# Patient Record
Sex: Male | Born: 1990 | Race: White | Hispanic: No | Marital: Single | Smoking: Never smoker
Health system: Southern US, Community
[De-identification: ages and names within clinical notes are randomized; demographics above are authoritative.]

## PROBLEM LIST (undated history)

## (undated) ENCOUNTER — Ambulatory Visit: Admission: EM | Payer: 59

## (undated) DIAGNOSIS — T7840XA Allergy, unspecified, initial encounter: Secondary | ICD-10-CM

## (undated) DIAGNOSIS — D689 Coagulation defect, unspecified: Secondary | ICD-10-CM

## (undated) HISTORY — DX: Allergy, unspecified, initial encounter: T78.40XA

## (undated) HISTORY — DX: Coagulation defect, unspecified: D68.9

---

## 2013-04-24 DIAGNOSIS — I82401 Acute embolism and thrombosis of unspecified deep veins of right lower extremity: Secondary | ICD-10-CM | POA: Insufficient documentation

## 2013-04-24 DIAGNOSIS — I2699 Other pulmonary embolism without acute cor pulmonale: Secondary | ICD-10-CM | POA: Insufficient documentation

## 2013-04-24 DIAGNOSIS — R55 Syncope and collapse: Secondary | ICD-10-CM | POA: Insufficient documentation

## 2013-05-14 DIAGNOSIS — R002 Palpitations: Secondary | ICD-10-CM | POA: Insufficient documentation

## 2020-04-05 ENCOUNTER — Ambulatory Visit: Payer: 59 | Attending: Internal Medicine

## 2020-04-05 DIAGNOSIS — Z23 Encounter for immunization: Secondary | ICD-10-CM

## 2020-04-05 NOTE — Progress Notes (Signed)
   Covid-19 Vaccination Clinic  Name:  Matthew Bass    MRN: [id number] DOB: 1990/03/30  04/05/2020  [name] was observed post Covid-19 immunization for 15 minutes without incident. He was provided with Vaccine Information Sheet and instruction to access the V-Safe system.   [name] was instructed to call 911 with any severe reactions post vaccine: Marland Kitchen Difficulty breathing  . Swelling of face and throat  . A fast heartbeat  . A bad rash all over body  . Dizziness and weakness   Immunizations Administered    Name Date Dose VIS Date Route   PFIZER Comrnaty(Gray TOP) Covid-19 Vaccine 04/05/2020  1:20 PM 0.3 mL 02/19/2020 Intramuscular   Manufacturer: [employer], [employer]   Lot: [id number]   NDC: [phone]

## 2020-04-28 ENCOUNTER — Other Ambulatory Visit: Payer: Self-pay

## 2020-04-28 ENCOUNTER — Ambulatory Visit: Payer: 59 | Admitting: Adult Health

## 2020-05-12 ENCOUNTER — Ambulatory Visit: Payer: 59 | Admitting: Adult Health

## 2020-05-21 ENCOUNTER — Ambulatory Visit
Admission: RE | Admit: 2020-05-21 | Discharge: 2020-05-21 | Disposition: A | Discharge status: Home / Self Care | Payer: 59 | Attending: Adult Health | Admitting: Adult Health

## 2020-05-21 ENCOUNTER — Encounter: Payer: Self-pay | Admitting: Adult Health

## 2020-05-21 ENCOUNTER — Ambulatory Visit
Admission: RE | Admit: 2020-05-21 | Discharge: 2020-05-21 | Disposition: A | Discharge status: Home / Self Care | Payer: 59 | Source: Ambulatory Visit | Attending: Adult Health | Admitting: Adult Health

## 2020-05-21 ENCOUNTER — Ambulatory Visit: Payer: 59 | Admitting: Adult Health

## 2020-05-21 ENCOUNTER — Other Ambulatory Visit: Payer: Self-pay

## 2020-05-21 VITALS — BP 123/57 | HR 63 | Temp 97.7°F | Resp 16 | Ht 70.0 in | Wt 169.2 lb

## 2020-05-21 DIAGNOSIS — Z1389 Encounter for screening for other disorder: Secondary | ICD-10-CM

## 2020-05-21 DIAGNOSIS — M542 Cervicalgia: Secondary | ICD-10-CM | POA: Diagnosis present

## 2020-05-21 DIAGNOSIS — M62838 Other muscle spasm: Secondary | ICD-10-CM | POA: Diagnosis not present

## 2020-05-21 DIAGNOSIS — D689 Coagulation defect, unspecified: Secondary | ICD-10-CM | POA: Diagnosis not present

## 2020-05-21 MED ORDER — CYCLOBENZAPRINE HCL 10 MG PO TABS: 10.0000 mg | ORAL_TABLET | Freq: Every day | ORAL | 0 refills | Status: DC

## 2020-05-21 NOTE — Progress Notes (Signed)
New patient visit   Patient: Matthew Bass   DOB: December 13, 1990   30 y.o. Male  MRN: [id number] Visit Date: 05/21/2020  Today's healthcare provider: Jairo Ben, FNP   Chief Complaint  Patient presents with  . New Patient (Initial Visit)   Subjective    Matthew Bass is a 30 y.o. male who presents today as a new patient to establish care.  HPI  Patient presents in office today to establish care he states that he feels well today but has concerns he would like to address. Patient reports that he follows a well balanced diet, he is actively exercising 5x a week and reports that sleep habits are fairly poor due to chronic neck pain.   Patient would like to discuss today getting a referral for chronic neck pain and migraines associated with neck pain. Patient states that he has seen chriropractor in the past and went through physical therapy, patient reports he normally gets migraines when pain in neck is triggered, he states that if he does not sit up straight his migraines could also be triggered by slouching in chair.   un sequenced disorder Blood clotting disorder, in his family. He had 5 DVT's and 7 Pulmonary embolisms. DR. Marcelino Scot he seen then and now he sees Dr. Reed [facility]ch at [facility]. He is anticoagulated. Xarelto 10mg  po qd.   He has headaches now rarely now but use to be 3 x's a week and mild headaches once a week. He is less on the computer now and this has lessened.   He has had x ray by chiropractor 1 year ago   Patient  denies any fever, body aches,chills, rash, chest pain, shortness of breath, nausea, vomiting, or diarrhea.  Denies dizziness, lightheadedness, pre syncopal or syncopal episodes.    Past Medical History:  Diagnosis Date  . Allergy   . Clotting disorder (HCC)    History reviewed. No pertinent surgical history. Family Status  Relation Name Status  . Mother  (Not Specified)  . Mat Uncle  (Not Specified)  . MGM  (Not Specified)   Family History  Problem  Relation Age of Onset  . Clotting disorder Mother   . Clotting disorder Maternal Uncle   . Clotting disorder Maternal Grandmother    Social History   Socioeconomic History  . Marital status: Single    Spouse name: Not on file  . Number of children: Not on file  . Years of education: Not on file  . Highest education level: Not on file  Occupational History  . Not on file  Tobacco Use  . Smoking status: Never Smoker  . Smokeless tobacco: Never Used  Substance and Sexual Activity  . Alcohol use: Not Currently  . Drug use: Never  . Sexual activity: Yes    Partners: Female  Other Topics Concern  . Not on file  Social History Narrative  . Not on file   Social Determinants of Health   Financial Resource Strain: Not on file  Food Insecurity: Not on file  Transportation Needs: Not on file  Physical Activity: Not on file  Stress: Not on file  Social Connections: Not on file   Outpatient Medications Prior to Visit  Medication Sig  . XARELTO 10 MG TABS tablet SMARTSIG:1 Tablet(s) By Mouth Every Evening  . [DISCONTINUED] pantoprazole (PROTONIX) 40 MG tablet Take 1 tablet by mouth daily.   No facility-administered medications prior to visit.   Allergies  Allergen Reactions  . Other  Pollen    Immunization History  Administered Date(s) Administered  . PFIZER Comirnaty(Gray Top)Covid-19 Tri-Sucrose Vaccine 04/05/2020    Health Maintenance  Topic Date Due  . Hepatitis C Screening  Never done  . HIV Screening  Never done  . TETANUS/TDAP  Never done  . INFLUENZA VACCINE  10/12/2019  . COVID-19 Vaccine (2 - Pfizer 3-dose series) 04/26/2020  . HPV VACCINES  Aged Out    Patient Care Team: Flinchum, Eula FriedMichelle S, FNP as PCP - General (Family Medicine)  Review of Systems  Eyes: Positive for photophobia.  Musculoskeletal: Positive for neck pain and neck stiffness.  Neurological: Positive for headaches.    Last CBC No results found for: WBC, HGB, HCT, MCV, MCH, RDW,  PLT Last metabolic panel No results found for: GLUCOSE, NA, K, CL, CO2, BUN, CREATININE, GFRNONAA, GFRAA, CALCIUM, PHOS, PROT, ALBUMIN, LABGLOB, AGRATIO, BILITOT, ALKPHOS, AST, ALT, ANIONGAP Last lipids No results found for: CHOL, HDL, LDLCALC, LDLDIRECT, TRIG, CHOLHDL Last hemoglobin A1c No results found for: HGBA1C Last thyroid functions No results found for: TSH, T3TOTAL, T4TOTAL, THYROIDAB Last vitamin D No results found for: 25OHVITD2, 25OHVITD3, VD25OH Last vitamin B12 and Folate No results found for: VITAMINB12, FOLATE    Objective    BP (!) 123/57   Pulse 63   Temp 97.7 F (36.5 C) (Oral)   Resp 16   Ht 5\' 10"  (1.778 m)   Wt 169 lb 3.2 oz (76.7 kg)   SpO2 98%   BMI 24.28 kg/m  Physical Exam Vitals and nursing note reviewed.  Constitutional:      General: He is not in acute distress.    Appearance: Normal appearance. He is well-developed. He is not ill-appearing, toxic-appearing or diaphoretic.     Comments: Patient is alert and oriented and responsive to questions Engages in eye contact with provider. Speaks in full sentences without any pauses without any shortness of breath or distress.    HENT:     Head: Normocephalic and atraumatic.     Right Ear: Hearing, tympanic membrane, ear canal and external ear normal.     Left Ear: Hearing, tympanic membrane, ear canal and external ear normal.     Nose: Nose normal.     Mouth/Throat:     Pharynx: Uvula midline. No oropharyngeal exudate.  Eyes:     General: Lids are normal. No scleral icterus.       Right eye: No discharge.        Left eye: No discharge.     Conjunctiva/sclera: Conjunctivae normal.     Pupils: Pupils are equal, round, and reactive to light.  Neck:     Thyroid: No thyromegaly.     Vascular: Normal carotid pulses. No carotid bruit, hepatojugular reflux or JVD.     Trachea: Trachea and phonation normal. No tracheal tenderness or tracheal deviation.     Meningeal: Brudzinski's sign absent.   Cardiovascular:     Rate and Rhythm: Normal rate and regular rhythm.     Pulses: Normal pulses.     Heart sounds: Normal heart sounds, S1 normal and S2 normal. Heart sounds not distant. No murmur heard. No friction rub. No gallop.   Pulmonary:     Effort: Pulmonary effort is normal. No accessory muscle usage or respiratory distress.     Breath sounds: Normal breath sounds. No stridor. No wheezing or rales.  Chest:     Chest wall: No tenderness.  Abdominal:     General: Bowel sounds are normal. There is no  distension.     Palpations: Abdomen is soft. There is no mass.     Tenderness: There is no abdominal tenderness. There is no guarding or rebound.     Hernia: No hernia is present.  Musculoskeletal:        General: No tenderness or deformity. Normal range of motion.     Cervical back: Full passive range of motion without pain, normal range of motion and neck supple. Muscular tenderness present. No spinous process tenderness.     Comments: Patient moves on and off of exam table and in room without difficulty. Gait is normal in hall and in room. Patient is oriented to person place time and situation. Patient answers questions appropriately and engages in conversation.   Lymphadenopathy:     Head:     Right side of head: No submental, submandibular, tonsillar, preauricular, posterior auricular or occipital adenopathy.     Left side of head: No submental, submandibular, tonsillar, preauricular, posterior auricular or occipital adenopathy.     Cervical: No cervical adenopathy.  Skin:    General: Skin is warm and dry.     Capillary Refill: Capillary refill takes less than 2 seconds.     Coloration: Skin is not pale.     Findings: No erythema or rash.     Nails: There is no clubbing.  Neurological:     Mental Status: He is alert and oriented to person, place, and time.     GCS: GCS eye subscore is 4. GCS verbal subscore is 5. GCS motor subscore is 6.     Cranial Nerves: No cranial nerve  deficit.     Sensory: No sensory deficit.     Motor: No abnormal muscle tone.     Coordination: Coordination normal.     Gait: Gait normal.     Deep Tendon Reflexes: Reflexes are normal and symmetric. Reflexes normal.  Psychiatric:        Speech: Speech normal.        Behavior: Behavior normal.        Thought Content: Thought content normal.        Judgment: Judgment normal.      Depression Screen PHQ 2/9 Scores 05/21/2020  PHQ - 2 Score 0  PHQ- 9 Score 0   No results found for any visits on 05/21/20.  Assessment & Plan       Muscle spasm - Plan: DG Cervical Spine Complete  Screening for blood or protein in urine - Plan: POCT urinalysis dipstick  Neck pain - Plan: CBC with Differential/Platelet, Comprehensive metabolic panel, Lipid panel, TSH, DG Cervical Spine Complete  Meds ordered this encounter  Medications  . cyclobenzaprine (FLEXERIL) 10 MG tablet    Sig: Take 1 tablet (10 mg total) by mouth at bedtime. Will make you sleepy.    Dispense:  30 tablet    Refill:  0   Red Flags discussed. The patient was given clear instructions to go to ER or return to medical center if any red flags develop, symptoms do not improve, worsen or new problems develop. They verbalized understanding.   Return in about 1 month (around 06/21/2020), or if symptoms worsen or fail to improve, for at any time for any worsening symptoms, Go to Emergency room/ urgent care if worse.    The entirety of the information documented in the History of Present Illness, Review of Systems and Physical Exam were personally obtained by me. Portions of this information were initially documented by the CMA and  reviewed by me for thoroughness and accuracy.     Jairo Ben, FNP  [facility] [phone] (phone) [phone] (fax)  [facility]

## 2020-05-21 NOTE — Patient Instructions (Addendum)

## 2020-05-21 NOTE — Progress Notes (Signed)
Cervical x ray is within normal limits.

## 2020-05-22 DIAGNOSIS — Z1389 Encounter for screening for other disorder: Secondary | ICD-10-CM | POA: Insufficient documentation

## 2020-05-22 DIAGNOSIS — M62838 Other muscle spasm: Secondary | ICD-10-CM | POA: Insufficient documentation

## 2020-05-22 DIAGNOSIS — M542 Cervicalgia: Secondary | ICD-10-CM | POA: Insufficient documentation

## 2020-05-24 ENCOUNTER — Other Ambulatory Visit: Payer: Self-pay | Admitting: Adult Health

## 2020-05-24 DIAGNOSIS — M542 Cervicalgia: Secondary | ICD-10-CM

## 2020-05-24 NOTE — Progress Notes (Signed)
Orders Placed This Encounter Procedures  Ambulatory referral to Orthopedic Surgery  Provider placed referral as above  he should hear within 2 weeks and if he does not please reach out to [place].

## 2020-05-24 NOTE — Progress Notes (Signed)
Cervical x ray is negative. Try muscle relaxer and stretching if not improving return to the office as discussed at prior office visit;.

## 2020-05-24 NOTE — Progress Notes (Signed)
Orders Placed This Encounter  Procedures  . Ambulatory referral to Orthopedic Surgery  Neck pain - Plan: Ambulatory referral to Orthopedic Surgery

## 2020-05-25 LAB — LIPID PANEL
Chol/HDL Ratio: 4.3 ratio (ref 0.0–5.0)
Cholesterol, Total: 181 mg/dL (ref 100–199)
HDL: 42 mg/dL (ref >39)
LDL Chol Calc (NIH): 99 mg/dL (ref 0–99)
Triglycerides: 233 mg/dL — ABNORMAL HIGH (ref 0–149)
VLDL Cholesterol Cal: 40 mg/dL (ref 5–40)

## 2020-05-25 LAB — COMPREHENSIVE METABOLIC PANEL
ALT: 71 IU/L — ABNORMAL HIGH (ref 0–44)
AST: 65 IU/L — ABNORMAL HIGH (ref 0–40)
Albumin/Globulin Ratio: 1.7 (ref 1.2–2.2)
Albumin: 4.2 g/dL (ref 4.0–5.0)
Alkaline Phosphatase: 100 IU/L (ref 44–121)
BUN/Creatinine Ratio: 16 (ref 9–20)
BUN: 14 mg/dL (ref 6–20)
Bilirubin Total: 0.4 mg/dL (ref 0.0–1.2)
CO2: 25 mmol/L (ref 20–29)
Calcium: 9.5 mg/dL (ref 8.7–10.2)
Chloride: 94 mmol/L — ABNORMAL LOW (ref 96–106)
Creatinine, Ser: 0.87 mg/dL (ref 0.76–1.27)
Globulin, Total: 2.5 g/dL (ref 1.5–4.5)
Glucose: 161 mg/dL — ABNORMAL HIGH (ref 65–99)
Potassium: 4.2 mmol/L (ref 3.5–5.2)
Sodium: 139 mmol/L (ref 134–144)
Total Protein: 6.7 g/dL (ref 6.0–8.5)
eGFR: 118 mL/min/{1.73_m2} (ref >59)

## 2020-05-25 LAB — TSH: TSH: 1.9 u[IU]/mL (ref 0.450–4.500)

## 2020-05-25 LAB — CBC WITH DIFFERENTIAL/PLATELET
Basophils Absolute: 0.1 10*3/uL (ref 0.0–0.2)
Basos: 1 %
EOS (ABSOLUTE): 0.3 10*3/uL (ref 0.0–0.4)
Eos: 3 %
Hematocrit: 48.6 % (ref 37.5–51.0)
Hemoglobin: 15.9 g/dL (ref 13.0–17.7)
Immature Grans (Abs): 0 10*3/uL (ref 0.0–0.1)
Immature Granulocytes: 0 %
Lymphocytes Absolute: 2.8 10*3/uL (ref 0.7–3.1)
Lymphs: 26 %
MCH: 27.5 pg (ref 26.6–33.0)
MCHC: 32.7 g/dL (ref 31.5–35.7)
MCV: 84 fL (ref 79–97)
Monocytes Absolute: 1 10*3/uL — ABNORMAL HIGH (ref 0.1–0.9)
Monocytes: 10 %
Neutrophils Absolute: 6.3 10*3/uL (ref 1.4–7.0)
Neutrophils: 60 %
Platelets: 260 10*3/uL (ref 150–450)
RBC: 5.78 x10E6/uL (ref 4.14–5.80)
RDW: 13.7 % (ref 11.6–15.4)
WBC: 10.5 10*3/uL (ref 3.4–10.8)

## 2020-05-27 NOTE — Progress Notes (Signed)
CBC ok.  CMP- glucose is elevated - add on hemoglobin A 1 C. AST/ ALT liver enzymes elevated, add on lab acute hepatitis panel and GGT.   Triglycerides elevated, likely from elevated glucose. Discuss lifestyle modification with patient e.g. increase exercise, fiber, fruits, vegetables, lean meat, and omega 3/fish intake and decrease saturated fat.  If patient following strict diet and exercise program already please schedule follow up appointment with primary care physician

## 2020-05-29 LAB — SPECIMEN STATUS REPORT

## 2020-05-29 LAB — HEMOGLOBIN A1C
Est. average glucose Bld gHb Est-mCnc: 148 mg/dL
Hgb A1c MFr Bld: 6.8 % — ABNORMAL HIGH (ref 4.8–5.6)

## 2020-05-31 ENCOUNTER — Telehealth: Payer: Self-pay

## 2020-05-31 MED ORDER — XARELTO 10 MG PO TABS: ORAL_TABLET | ORAL | 3 refills | Status: DC

## 2020-05-31 NOTE — Telephone Encounter (Signed)
Ok to fill, he has known clotting disorder.

## 2020-05-31 NOTE — Progress Notes (Signed)
Hemoglobin A1C returned Diabetic range. Need to increase exercise and monitor diet can discuss treatment at visit already scheduled for follow up.

## 2020-05-31 NOTE — Telephone Encounter (Signed)
[facility] faxed refill request for the following medications:  XARELTO 10 MG TABS tablet   Please advise.

## 2020-05-31 NOTE — Telephone Encounter (Signed)
Please review. KW 

## 2020-06-07 ENCOUNTER — Ambulatory Visit: Payer: Self-pay | Admitting: Adult Health

## 2020-06-07 DIAGNOSIS — D509 Iron deficiency anemia, unspecified: Secondary | ICD-10-CM | POA: Insufficient documentation

## 2020-06-07 NOTE — Progress Notes (Deleted)
Acute Office Visit  Subjective:    Patient ID: Matthew Bass, male    DOB: [birth year], 30 y.o.   MRN: [id number]  No chief complaint on file.   HPI Patient is in today to discuss results of recent labs.  He has concerns about his Hgb A1C being 6.8 on 05/24/20.  He reports he follows a healthy diet and is actively exercising.  Past Medical History:  Diagnosis Date  . Allergy   . Clotting disorder (HCC)     No past surgical history on file.  Family History  Problem Relation Age of Onset  . Clotting disorder Mother   . Clotting disorder Maternal Uncle   . Clotting disorder Maternal Grandmother     Social History   Socioeconomic History  . Marital status: Single    Spouse name: Not on file  . Number of children: Not on file  . Years of education: Not on file  . Highest education level: Not on file  Occupational History  . Not on file  Tobacco Use  . Smoking status: Never Smoker  . Smokeless tobacco: Never Used  Substance and Sexual Activity  . Alcohol use: Not Currently  . Drug use: Never  . Sexual activity: Yes    Partners: Female  Other Topics Concern  . Not on file  Social History Narrative  . Not on file   Social Determinants of Health   Financial Resource Strain: Not on file  Food Insecurity: Not on file  Transportation Needs: Not on file  Physical Activity: Not on file  Stress: Not on file  Social Connections: Not on file  Intimate Partner Violence: Not on file    Outpatient Medications Prior to Visit  Medication Sig Dispense Refill  . cyclobenzaprine (FLEXERIL) 10 MG tablet Take 1 tablet (10 mg total) by mouth at bedtime. Will make you sleepy. 30 tablet 0  . XARELTO 10 MG TABS tablet SMARTSIG:1 Tablet(s) By Mouth Every Evening 30 tablet 3   No facility-administered medications prior to visit.    Allergies  Allergen Reactions  . Other     Pollen    Review of Systems     Objective:    Physical Exam  There were no vitals taken for this  visit. Wt Readings from Last 3 Encounters:  05/21/20 169 lb 3.2 oz (76.7 kg)    Health Maintenance Due  Topic Date Due  . Hepatitis C Screening  Never done  . HIV Screening  Never done  . TETANUS/TDAP  Never done  . INFLUENZA VACCINE  10/12/2019  . COVID-19 Vaccine (2 - Pfizer 3-dose series) 04/26/2020    There are no preventive care reminders to display for this patient.   Lab Results  Component Value Date   TSH 1.900 05/24/2020   Lab Results  Component Value Date   WBC 10.5 05/24/2020   HGB 15.9 05/24/2020   HCT 48.6 05/24/2020   MCV 84 05/24/2020   PLT 260 05/24/2020   Lab Results  Component Value Date   NA 139 05/24/2020   K 4.2 05/24/2020   CO2 25 05/24/2020   GLUCOSE 161 (H) 05/24/2020   BUN 14 05/24/2020   CREATININE 0.87 05/24/2020   BILITOT 0.4 05/24/2020   ALKPHOS 100 05/24/2020   AST 65 (H) 05/24/2020   ALT 71 (H) 05/24/2020   PROT 6.7 05/24/2020   ALBUMIN 4.2 05/24/2020   CALCIUM 9.5 05/24/2020   Lab Results  Component Value Date   CHOL 181 05/24/2020  Lab Results  Component Value Date   HDL 42 05/24/2020   Lab Results  Component Value Date   LDLCALC 99 05/24/2020   Lab Results  Component Value Date   TRIG 233 (H) 05/24/2020   Lab Results  Component Value Date   CHOLHDL 4.3 05/24/2020   Lab Results  Component Value Date   HGBA1C 6.8 (H) 05/24/2020       Assessment & Plan:   Problem List Items Addressed This Visit   None      No orders of the defined types were placed in this encounter.    Adline Peals, CMA

## 2020-06-10 ENCOUNTER — Telehealth (INDEPENDENT_AMBULATORY_CARE_PROVIDER_SITE_OTHER): Payer: 59 | Admitting: Adult Health

## 2020-06-10 ENCOUNTER — Other Ambulatory Visit: Payer: Self-pay | Admitting: Adult Health

## 2020-06-10 ENCOUNTER — Encounter: Payer: Self-pay | Admitting: Adult Health

## 2020-06-10 DIAGNOSIS — E119 Type 2 diabetes mellitus without complications: Secondary | ICD-10-CM

## 2020-06-10 DIAGNOSIS — R748 Abnormal levels of other serum enzymes: Secondary | ICD-10-CM | POA: Diagnosis not present

## 2020-06-10 MED ORDER — BLOOD GLUCOSE METER KIT: PACK | 0 refills | Status: DC

## 2020-06-10 NOTE — Patient Instructions (Signed)
Blood Glucose Monitoring, Adult Monitoring your blood sugar (glucose) is an important part of managing your diabetes. Blood glucose monitoring involves checking your blood glucose as often as directed and keeping a log or record of your results over time. Checking your blood glucose regularly and keeping a blood glucose log can:  Help you and your health care provider adjust your diabetes management plan as needed, including your medicines or insulin.  Help you understand how food, exercise, illnesses, and medicines affect your blood glucose.  Let you know what your blood glucose is at any time. You can quickly find out if you have low blood glucose (hypoglycemia) or high blood glucose (hyperglycemia). Your health care provider will set individualized treatment goals for you. Your goals will be based on your age, other medical conditions you have, and how you respond to diabetes treatment. Generally, the goal of treatment is to maintain the following blood glucose levels:  Before meals (preprandial): 80-130 mg/dL (4.4-7.2 mmol/L).  After meals (postprandial): below 180 mg/dL (10 mmol/L).  A1C level: less than 7%. Supplies needed:  Blood glucose meter.  Test strips for your meter. Each meter has its own strips. You must use the strips that came with your meter.  A needle to prick your finger (lancet). Do not use a lancet more than one time.  A device that holds the lancet (lancing device).  A journal or log book to write down your results. How to check your blood glucose Checking your blood glucose 1. Wash your hands for at least 20 seconds with soap and water. 2. Prick the side of your finger (not the tip) with the lancet. Do not use the same finger consecutively. 3. Gently rub the finger until a small drop of blood appears. 4. Follow instructions that come with your meter for inserting the test strip, applying blood to the strip, and using your blood glucose meter. 5. Write down  your result and any notes in your log.   Using alternative sites Some meters allow you to use areas of your body other than your finger (alternative sites) to test your blood. The most common alternative sites are the forearm, the thigh, and the palm of your hand. Alternative sites may not be as accurate as the fingers because blood flow is slower in those areas. This means that the result you get may be delayed, and it may be different from the result that you would get from your finger. Use the finger only, and do not use alternative sites, if:  You think you have hypoglycemia.  You sometimes do not know that your blood glucose is getting low (hypoglycemia unawareness). General tips and recommendations Blood glucose log  Every time you check your blood glucose, write down your result. Also write down any notes about things that may be affecting your blood glucose, such as your diet and exercise for the day. This information can help you and your health care provider: ? Look for patterns in your blood glucose over time. ? Adjust your diabetes management plan as needed.  Check if your meter allows you to download your records to a computer or if there is an app for the meter. Most glucose meters store a record of glucose readings in the meter.   If you have type 1 diabetes:  Check your blood glucose 4 or more times a day if you are on intensive insulin therapy with multiple daily injections (MDI) or if you are using an insulin pump. Check your  blood glucose: ? Before every meal and snack. ? Before bedtime.  Also check your blood glucose: ? If you have symptoms of hypoglycemia. ? After treating low blood glucose. ? Before doing activities that create a risk for injury, like driving or using machinery. ? Before and after exercise. ? Two hours after a meal. ? Occasionally between 2:00 a.m. and 3:00 a.m., as directed.  You may need to check your blood glucose more often, 6-10 times per  day, if: ? You have diabetes that is not well controlled. ? You are ill. ? You have a history of severe hypoglycemia. ? You have hypoglycemia unawareness. If you have type 2 diabetes:  Check your blood glucose 2 or more times a day if you take insulin or other diabetes medicines.  Check your blood glucose 4 or more times a day if you are on intensive insulin therapy. Occasionally, you may also need to check your glucose between 2:00 a.m. and 3:00 a.m., as directed.  Also check your blood glucose: ? Before and after exercise. ? Before doing activities that create a risk for injury, like driving or using machinery.  You may need to check your blood glucose more often if: ? Your medicine is being adjusted. ? Your diabetes is not well controlled. ? You are ill. General tips  Make sure you always have your supplies with you.  After you use a few boxes of test strips, adjust (calibrate) your blood glucose meter by following instructions that came with your meter.  If you have questions or need help, all blood glucose meters have a 24-hour hotline phone number available that you can call. Also contact your health care provider with questions or concerns you may have. Where to find more information  The American Diabetes Association: www.diabetes.org  The Association of Diabetes Care & Education Specialists: www.diabeteseducator.org Contact a health care provider if:  Your blood glucose is at or above 240 mg/dL (13.3 mmol/L) for 2 days in a row.  You have been sick or have had a fever for 2 days or longer, and you are not getting better.  You have any of the following problems for more than 6 hours: ? You cannot eat or drink. ? You have nausea or vomiting. ? You have diarrhea. Get help right away if:  Your blood glucose is lower than 54 mg/dL (3 mmol/L).  You become confused, or you have trouble thinking clearly.  You have difficulty breathing.  You have moderate or large  ketone levels in your urine. These symptoms may represent a serious problem that is an emergency. Do not wait to see if the symptoms will go away. Get medical help right away. Call your local emergency services (911 in the [place] Do not drive yourself to the hospital. Summary  Monitoring your blood glucose is an important part of managing your diabetes.  Blood glucose monitoring involves checking your blood glucose as often as directed and keeping a log or record of your results over time.  Your health care provider will set individualized treatment goals for you. Your goals will be based on your age, other medical conditions you have, and how you respond to diabetes treatment.  Every time you check your blood glucose, write down your result. Also, write down any notes about things that may be affecting your blood glucose, such as your diet and exercise for the day. This information is not intended to replace advice given to you by your health care provider. Make sure  you discuss any questions you have with your health care provider. Document Revised: 11/26/2019 Document Reviewed: 11/26/2019 Elsevier Patient Education  2021 [employer]. Diabetes Mellitus Basics  Diabetes mellitus, or diabetes, is a long-term (chronic) disease. It occurs when the body does not properly use sugar (glucose) that is released from food after you eat. Diabetes mellitus may be caused by one or both of these problems:  Your pancreas does not make enough of a hormone called insulin.  Your body does not react in a normal way to the insulin that it makes. Insulin lets glucose enter cells in your body. This gives you energy. If you have diabetes, glucose cannot get into cells. This causes high blood glucose (hyperglycemia). How to treat and manage diabetes You may need to take insulin or other diabetes medicines daily to keep your glucose in balance. If you are prescribed insulin, you will learn how to give yourself  insulin by injection. You may need to adjust the amount of insulin you take based on the foods that you eat. You will need to check your blood glucose levels using a glucose monitor as told by your health care provider. The readings can help determine if you have low or high blood glucose. Generally, you should have these blood glucose levels:  Before meals (preprandial): 80-130 mg/dL (1.6-1.0 mmol/L).  After meals (postprandial): below 180 mg/dL (10 mmol/L).  Hemoglobin A1c (HbA1c) level: less than 7%. Your health care provider will set treatment goals for you. Keep all follow-up visits. This is important. Follow these instructions at home: Diabetes medicines Take your diabetes medicines every day as told by your health care provider. List your diabetes medicines here:  Name of medicine: ______________________________ ? Amount (dose): _______________ Time (a.m./p.m.): _______________ Notes: ___________________________________  Name of medicine: ______________________________ ? Amount (dose): _______________ Time (a.m./p.m.): _______________ Notes: ___________________________________  Name of medicine: ______________________________ ? Amount (dose): _______________ Time (a.m./p.m.): _______________ Notes: ___________________________________ Insulin If you use insulin, list the types of insulin you use here:  Insulin type: ______________________________ ? Amount (dose): _______________ Time (a.m./p.m.): _______________Notes: ___________________________________  Insulin type: ______________________________ ? Amount (dose): _______________ Time (a.m./p.m.): _______________ Notes: ___________________________________  Insulin type: ______________________________ ? Amount (dose): _______________ Time (a.m./p.m.): _______________ Notes: ___________________________________  Insulin type: ______________________________ ? Amount (dose): _______________ Time (a.m./p.m.): _______________  Notes: ___________________________________  Insulin type: ______________________________ ? Amount (dose): _______________ Time (a.m./p.m.): _______________ Notes: ___________________________________ Managing blood glucose Check your blood glucose levels using a glucose monitor as told by your health care provider. Write down the times that you check your glucose levels here:  Time: _______________ Notes: ___________________________________  Time: _______________ Notes: ___________________________________  Time: _______________ Notes: ___________________________________  Time: _______________ Notes: ___________________________________  Time: _______________ Notes: ___________________________________  Time: _______________ Notes: ___________________________________   Low blood glucose Low blood glucose (hypoglycemia) is when glucose is at or below 70 mg/dL (3.9 mmol/L). Symptoms may include:  Feeling: ? Hungry. ? Sweaty and clammy. ? Irritable or easily upset. ? Dizzy. ? Sleepy.  Having: ? A fast heartbeat. ? A headache. ? A change in your vision. ? Numbness around the mouth, lips, or tongue.  Having trouble with: ? Moving (coordination). ? Sleeping. Treating low blood glucose To treat low blood glucose, eat or drink something containing sugar right away. If you can think clearly and swallow safely, follow the 15:15 rule:  Take 15 grams of a fast-acting carb (carbohydrate), as told by your health care provider.  Some fast-acting carbs are: ? Glucose tablets: take 3-4 tablets. ? Hard candy: eat  3-5 pieces. ? Fruit juice: drink 4 oz (120 mL). ? Regular (not diet) soda: drink 4-6 oz (120-180 mL). ? Honey or sugar: eat 1 Tbsp (15 mL).  Check your blood glucose levels 15 minutes after you take the carb.  If your glucose is still at or below 70 mg/dL (3.9 mmol/L), take 15 grams of a carb again.  If your glucose does not go above 70 mg/dL (3.9 mmol/L) after 3 tries, get  help right away.  After your glucose goes back to normal, eat a meal or a snack within 1 hour. Treating very low blood glucose If your glucose is at or below 54 mg/dL (3 mmol/L), you have very low blood glucose (severe hypoglycemia). This is an emergency. Do not wait to see if the symptoms will go away. Get medical help right away. Call your local emergency services (911 in the [place] Do not drive yourself to the hospital. Questions to ask your health care provider  Should I talk with a diabetes educator?  What equipment will I need to care for myself at home?  What diabetes medicines do I need? When should I take them?  How often do I need to check my blood glucose levels?  What number can I call if I have questions?  When is my follow-up visit?  Where can I find a support group for people with diabetes? Where to find more information  American Diabetes Association: www.diabetes.org  Association of Diabetes Care and Education Specialists: www.diabeteseducator.org Contact a health care provider if:  Your blood glucose is at or above 240 mg/dL (95.6 mmol/L) for 2 days in a row.  You have been sick or have had a fever for 2 days or more, and you are not getting better.  You have any of these problems for more than 6 hours: ? You cannot eat or drink. ? You feel nauseous. ? You vomit. ? You have diarrhea. Get help right away if:  Your blood glucose is lower than 54 mg/dL (3 mmol/L).  You get confused.  You have trouble thinking clearly.  You have trouble breathing. These symptoms may represent a serious problem that is an emergency. Do not wait to see if the symptoms will go away. Get medical help right away. Call your local emergency services (911 in the [place] Do not drive yourself to the hospital. Summary  Diabetes mellitus is a chronic disease that occurs when the body does not properly use sugar (glucose) that is released from food after you eat.  Take insulin and  diabetes medicines as told.  Check your blood glucose every day, as often as told.  Keep all follow-up visits. This is important. This information is not intended to replace advice given to you by your health care provider. Make sure you discuss any questions you have with your health care provider. Document Revised: 07/01/2019 Document Reviewed: 07/01/2019 Elsevier Patient Education  2021 Elsevier Inc. Diabetes Mellitus and Nutrition, Adult When you have diabetes, or diabetes mellitus, it is very important to have healthy eating habits because your blood sugar (glucose) levels are greatly affected by what you eat and drink. Eating healthy foods in the right amounts, at about the same times every day, can help you:  Control your blood glucose.  Lower your risk of heart disease.  Improve your blood pressure.  Reach or maintain a healthy weight. What can affect my meal plan? Every person with diabetes is different, and each person has different needs for a meal  plan. Your health care provider may recommend that you work with a dietitian to make a meal plan that is best for you. Your meal plan may vary depending on factors such as:  The calories you need.  The medicines you take.  Your weight.  Your blood glucose, blood pressure, and cholesterol levels.  Your activity level.  Other health conditions you have, such as heart or kidney disease. How do carbohydrates affect me? Carbohydrates, also called carbs, affect your blood glucose level more than any other type of food. Eating carbs naturally raises the amount of glucose in your blood. Carb counting is a method for keeping track of how many carbs you eat. Counting carbs is important to keep your blood glucose at a healthy level, especially if you use insulin or take certain oral diabetes medicines. It is important to know how many carbs you can safely have in each meal. This is different for every person. Your dietitian can help you  calculate how many carbs you should have at each meal and for each snack. How does alcohol affect me? Alcohol can cause a sudden decrease in blood glucose (hypoglycemia), especially if you use insulin or take certain oral diabetes medicines. Hypoglycemia can be a life-threatening condition. Symptoms of hypoglycemia, such as sleepiness, dizziness, and confusion, are similar to symptoms of having too much alcohol.  Do not drink alcohol if: ? Your health care provider tells you not to drink. ? You are pregnant, may be pregnant, or are planning to become pregnant.  If you drink alcohol: ? Do not drink on an empty stomach. ? Limit how much you use to:  0-1 drink a day for women.  0-2 drinks a day for men. ? Be aware of how much alcohol is in your drink. In the [place] one drink equals one 12 oz bottle of beer (355 mL), one 5 oz glass of wine (148 mL), or one 1 oz glass of hard liquor (44 mL). ? Keep yourself hydrated with water, diet soda, or unsweetened iced tea.  Keep in mind that regular soda, juice, and other mixers may contain a lot of sugar and must be counted as carbs. What are tips for following this plan? Reading food labels  Start by checking the serving size on the "Nutrition Facts" label of packaged foods and drinks. The amount of calories, carbs, fats, and other nutrients listed on the label is based on one serving of the item. Many items contain more than one serving per package.  Check the total grams (g) of carbs in one serving. You can calculate the number of servings of carbs in one serving by dividing the total carbs by 15. For example, if a food has 30 g of total carbs per serving, it would be equal to 2 servings of carbs.  Check the number of grams (g) of saturated fats and trans fats in one serving. Choose foods that have a low amount or none of these fats.  Check the number of milligrams (mg) of salt (sodium) in one serving. Most people should limit total sodium intake to  less than 2,300 mg per day.  Always check the nutrition information of foods labeled as "low-fat" or "nonfat." These foods may be higher in added sugar or refined carbs and should be avoided.  Talk to your dietitian to identify your daily goals for nutrients listed on the label. Shopping  Avoid buying canned, pre-made, or processed foods. These foods tend to be high in fat, sodium,  and added sugar.  Shop around the outside edge of the grocery store. This is where you will most often find fresh fruits and vegetables, bulk grains, fresh meats, and fresh dairy. Cooking  Use low-heat cooking methods, such as baking, instead of high-heat cooking methods like deep frying.  Cook using healthy oils, such as olive, canola, or sunflower oil.  Avoid cooking with butter, cream, or high-fat meats. Meal planning  Eat meals and snacks regularly, preferably at the same times every day. Avoid going long periods of time without eating.  Eat foods that are high in fiber, such as fresh fruits, vegetables, beans, and whole grains. Talk with your dietitian about how many servings of carbs you can eat at each meal.  Eat 4-6 oz (112-168 g) of lean protein each day, such as lean meat, chicken, fish, eggs, or tofu. One ounce (oz) of lean protein is equal to: ? 1 oz (28 g) of meat, chicken, or fish. ? 1 egg. ?  cup (62 g) of tofu.  Eat some foods each day that contain healthy fats, such as avocado, nuts, seeds, and fish.   What foods should I eat? Fruits Berries. Apples. Oranges. Peaches. Apricots. Plums. Grapes. Mango. Papaya. Pomegranate. Kiwi. Cherries. Vegetables Lettuce. Spinach. Leafy greens, including kale, chard, collard greens, and mustard greens. Beets. Cauliflower. Cabbage. Broccoli. Carrots. Green beans. Tomatoes. Peppers. Onions. Cucumbers. Brussels sprouts. Grains Whole grains, such as whole-wheat or whole-grain bread, crackers, tortillas, cereal, and pasta. Unsweetened oatmeal. Quinoa. Brown  or wild rice. Meats and other proteins Seafood. Poultry without skin. Lean cuts of poultry and beef. Tofu. Nuts. Seeds. Dairy Low-fat or fat-free dairy products such as milk, yogurt, and cheese. The items listed above may not be a complete list of foods and beverages you can eat. Contact a dietitian for more information. What foods should I avoid? Fruits Fruits canned with syrup. Vegetables Canned vegetables. Frozen vegetables with butter or cream sauce. Grains Refined white flour and flour products such as bread, pasta, snack foods, and cereals. Avoid all processed foods. Meats and other proteins Fatty cuts of meat. Poultry with skin. Breaded or fried meats. Processed meat. Avoid saturated fats. Dairy Full-fat yogurt, cheese, or milk. Beverages Sweetened drinks, such as soda or iced tea. The items listed above may not be a complete list of foods and beverages you should avoid. Contact a dietitian for more information. Questions to ask a health care provider  Do I need to meet with a diabetes educator?  Do I need to meet with a dietitian?  What number can I call if I have questions?  When are the best times to check my blood glucose? Where to find more information:  American Diabetes Association: diabetes.org  Academy of Nutrition and Dietetics: www.eatright.[employer]  National Institute of Diabetes and Digestive and Kidney Diseases: [employer].tnwww.niddk.nih.gov  Association of Diabetes Care and Education Specialists: www.diabeteseducator.org Summary  It is important to have healthy eating habits because your blood sugar (glucose) levels are greatly affected by what you eat and drink.  A healthy meal plan will help you control your blood glucose and maintain a healthy lifestyle.  Your health care provider may recommend that you work with a dietitian to make a meal plan that is best for you.  Keep in mind that carbohydrates (carbs) and alcohol have immediate effects on your blood glucose  levels. It is important to count carbs and to use alcohol carefully. This information is not intended to replace advice given to you by your  health care provider. Make sure you discuss any questions you have with your health care provider. Document Revised: 02/04/2019 Document Reviewed: 02/04/2019 Elsevier Patient Education  2021 [employer].

## 2020-06-10 NOTE — Progress Notes (Signed)
  MyChart Video Visit    Virtual Visit via Video Note   This visit type was conducted due to national recommendations for restrictions regarding the COVID-19 Pandemic (e.g. social distancing) in an effort to limit this patient's exposure and mitigate transmission in our community. This patient is at least at moderate risk for complications without adequate follow up. This format is felt to be most appropriate for this patient at this time. Physical exam was limited by quality of the video and audio technology used for the visit.  Parties involved in visit as below:   Patient location: at home Provider location: Provider: Provider's office at  [facility], [place].  I discussed the limitations of evaluation and management by telemedicine and the availability of in person appointments. The patient expressed understanding and agreed to proceed.  Patient: Matthew Bass   DOB: 12/18/1990   30 y.o. Male  MRN: [id number] Visit Date: 06/10/2020  Today's healthcare provider: Michelle Smith Flinchum, FNP   Chief Complaint  Patient presents with  . Abnormal Lab    Patient presents today to address concern of recent lab results and diabetes diagnosis.    Subjective    HPI HPI    Abnormal Lab     Additional comments: Patient presents today to address concern of recent lab results and diabetes diagnosis.        Last edited by Wolford, Kathleen J, CMA on 06/10/2020  9:37 AM. (History)      Patient  denies any fever, body aches,chills, rash, chest pain, shortness of breath, nausea, vomiting, or diarrhea.  Denies dizziness, lightheadedness, pre syncopal or syncopal episodes.     Patient Active Problem List   Diagnosis Date Noted  . Iron deficiency anemia, unspecified 06/07/2020  . Muscle spasm 05/22/2020  . Screening for blood or protein in urine 05/22/2020  . Neck pain 05/22/2020  . Heart palpitations 05/14/2013  . Bilateral pulmonary embolism (HCC) 04/24/2013  .  Right leg DVT (HCC) 04/24/2013  . Syncope 04/24/2013   Past Medical History:  Diagnosis Date  . Allergy   . Clotting disorder (HCC)    Family History  Problem Relation Age of Onset  . Clotting disorder Mother   . Clotting disorder Maternal Uncle   . Clotting disorder Maternal Grandmother    Allergies  Allergen Reactions  . Other     Pollen      Medications: Outpatient Medications Prior to Visit  Medication Sig  . cyclobenzaprine (FLEXERIL) 10 MG tablet Take 1 tablet (10 mg total) by mouth at bedtime. Will make you sleepy.  . meloxicam (MOBIC) 15 MG tablet Take 15 mg by mouth daily.  . XARELTO 10 MG TABS tablet SMARTSIG:1 Tablet(s) By Mouth Every Evening   No facility-administered medications prior to visit.    Review of Systems  All other systems reviewed and are negative.   Last metabolic panel Lab Results  Component Value Date   GLUCOSE 161 (H) 05/24/2020   NA 139 05/24/2020   K 4.2 05/24/2020   CL 94 (L) 05/24/2020   CO2 25 05/24/2020   BUN 14 05/24/2020   CREATININE 0.87 05/24/2020   CALCIUM 9.5 05/24/2020   PROT 6.7 05/24/2020   ALBUMIN 4.2 05/24/2020   LABGLOB 2.5 05/24/2020   AGRATIO 1.7 05/24/2020   BILITOT 0.4 05/24/2020   ALKPHOS 100 05/24/2020   AST 65 (H) 05/24/2020   ALT 71 (H) 05/24/2020   Last lipids Lab Results  Component Value Date   CHOL 181 05/24/2020     HDL 42 05/24/2020   LDLCALC 99 05/24/2020   TRIG 233 (H) 05/24/2020   CHOLHDL 4.3 05/24/2020   Last hemoglobin A1c Lab Results  Component Value Date   HGBA1C 6.8 (H) 05/24/2020      Objective    There were no vitals taken for this visit.   Physical Exam     Patient is alert and oriented and responsive to questions Engages in conversation with provider. Speaks in full sentences without any pauses without any shortness of breath or distress.    Assessment & Plan     Diabetes mellitus without complication (HCC) - Plan: C-peptide, HgB A1c, Comprehensive metabolic panel,  blood glucose meter kit and supplies  Elevated liver enzymes - Plan: Lipid Panel w/o Chol/HDL Ratio, Gamma GT   He will make dietary and lifestyle changes and will monitor blood glucose.   Return in about 3 months (around 09/09/2020), or if symptoms worsen or fail to improve, for at any time for any worsening symptoms, Go to Emergency room/ urgent care if worse.     I discussed the assessment and treatment plan with the patient. The patient was provided an opportunity to ask questions and all were answered. The patient agreed with the plan and demonstrated an understanding of the instructions.   The patient was advised to call back or seek an in-person evaluation if the symptoms worsen or if the condition fails to improve as anticipated.  I provided 30 minutes of non-face-to-face time during this encounter.  The entirety of the information documented in the History of Present Illness, Review of Systems and Physical Exam were personally obtained by me. Portions of this information were initially documented by the CMA and reviewed by me for thoroughness and accuracy.   I discussed the limitations of evaluation and management by telemedicine and the availability of in person appointments. The patient expressed understanding and agreed to proceed.  Michelle Smith Flinchum, FNP [facility] [phone] (phone) [phone] (fax)  [facility]  

## 2020-06-10 NOTE — Telephone Encounter (Signed)
Requested medication (s) are due for refill today:   Ordered this morning by Michelle Flinchum  Requested medication (s) are on the active medication list:   Yes  Future visit scheduled:   Yes   Last ordered: Today 3/31  Returned because pharmacy needing the Rx clarified.   See their note.   Requested Prescriptions  Pending Prescriptions Disp Refills   Blood Glucose Monitoring Suppl (ACCU-CHEK GUIDE ME) w/Device KIT [Pharmacy Med Name: ACCU-CHEK GUIDE ME GLUCOSE MTR]  0    Sig: PLEASE SEE ATTACHED FOR DETAILED DIRECTIONS      Endocrinology: Diabetes - Testing Supplies Passed - 06/10/2020 11:12 AM      Passed - Valid encounter within last 12 months    Recent Outpatient Visits           Today Diabetes mellitus without complication (HCC)   [facility] Flinchum, Michelle S, FNP   2 weeks ago Muscle spasm   [facility] Flinchum, Michelle S, FNP       Future Appointments             In 2 weeks Flinchum, Michelle S, FNP [facility]   In 1 month Holdsworth, Karen, NP [facility]               

## 2020-06-17 ENCOUNTER — Other Ambulatory Visit: Payer: Self-pay

## 2020-06-17 DIAGNOSIS — E119 Type 2 diabetes mellitus without complications: Secondary | ICD-10-CM

## 2020-06-17 MED ORDER — ACCU-CHEK AVIVA PLUS W/DEVICE KIT: 1.0000 | PACK | Freq: Every day | 0 refills | Status: DC

## 2020-06-17 MED ORDER — ACCU-CHEK MULTICLIX LANCETS MISC: 12 refills | Status: DC

## 2020-06-17 MED ORDER — ACCU-CHEK AVIVA PLUS VI STRP: ORAL_STRIP | 12 refills | Status: DC

## 2020-06-17 NOTE — Telephone Encounter (Signed)
Original prescription does show that teststrips and lancets were ordered. KW

## 2020-06-17 NOTE — Telephone Encounter (Signed)
Ok to send with lancets and test strips with meter.

## 2020-06-18 ENCOUNTER — Encounter: Payer: Self-pay | Admitting: Adult Health

## 2020-06-18 ENCOUNTER — Other Ambulatory Visit: Payer: Self-pay | Admitting: Adult Health

## 2020-06-18 MED ORDER — BLOOD GLUCOSE METER KIT: PACK | 0 refills | Status: DC

## 2020-06-18 NOTE — Telephone Encounter (Signed)
Printed hard script sent patient message in my chart to pick up.

## 2020-06-21 ENCOUNTER — Ambulatory Visit: Payer: Self-pay | Admitting: Adult Health

## 2020-06-28 NOTE — Progress Notes (Signed)
      Established patient visit   Patient: Matthew Bass   DOB: [birth year]   30 y.o. Male  MRN: [id number] Visit Date: 06/29/2020  Today's healthcare provider: Beverely Pace Yoshie Kosel, FNP   No show for scheduled office visit.

## 2020-06-29 ENCOUNTER — Ambulatory Visit (INDEPENDENT_AMBULATORY_CARE_PROVIDER_SITE_OTHER): Payer: Self-pay | Admitting: Adult Health

## 2020-06-29 ENCOUNTER — Encounter: Payer: Self-pay | Admitting: Adult Health

## 2020-06-29 DIAGNOSIS — Z91199 Patient's noncompliance with other medical treatment and regimen due to unspecified reason: Secondary | ICD-10-CM | POA: Insufficient documentation

## 2020-06-29 DIAGNOSIS — Z5329 Procedure and treatment not carried out because of patient's decision for other reasons: Secondary | ICD-10-CM | POA: Insufficient documentation

## 2020-07-12 NOTE — Progress Notes (Signed)
BP 113/64   Pulse 97   Temp 98.7 F (37.1 C)   Ht 5' 10.83" (1.799 m)   Wt 169 lb (76.7 kg)   SpO2 97%   BMI 23.69 kg/m    Subjective:    Patient ID: Matthew Bass, male    DOB: 26-Jul-1990, 30 y.o.   MRN: [id number]  HPI: Matthew Bass is a 30 y.o. male  Chief Complaint  Patient presents with  . Establish Care  . Diabetes    Patient was told that he was diabetic at his previous doctor, would to have his A1C rechecked    Patient presents to clinic to establish care with new PCP.  Patient reports a history of diabetes and migraines.  Patient unsure of whether he is diabetic or not, would like to find out for sure.    Patient has been on Xerelto since 2014. Had a total of 12 clots between his right leg and Pulmonary embolism.  Patient's family does have a clotting disorder.  Patient's hematologist does not advise him to stop the [facility].    Patient denies a history of: Hypertension, Elevated Cholesterol, Thyroid problems, Depression, Anxiety, Neurological problems, and Abdominal problems.    Denies HA, CP, SOB, dizziness, palpitations, visual changes, and lower extremity swelling.  Relevant past medical, surgical, family and social history reviewed and updated as indicated. Interim medical history since our last visit reviewed. Allergies and medications reviewed and updated.  Review of Systems  Eyes: Negative for visual disturbance.  Respiratory: Negative for shortness of breath.   Cardiovascular: Negative for chest pain and leg swelling.  Neurological: Negative for light-headedness and headaches.    Per HPI unless specifically indicated above     Objective:    BP 113/64   Pulse 97   Temp 98.7 F (37.1 C)   Ht 5' 10.83" (1.799 m)   Wt 169 lb (76.7 kg)   SpO2 97%   BMI 23.69 kg/m   Wt Readings from Last 3 Encounters:  07/13/20 169 lb (76.7 kg)  05/21/20 169 lb 3.2 oz (76.7 kg)    Physical Exam Vitals and nursing note reviewed.  Constitutional:      General: He  is not in acute distress.    Appearance: Normal appearance. He is not ill-appearing, toxic-appearing or diaphoretic.  HENT:     Head: Normocephalic.     Right Ear: External ear normal.     Left Ear: External ear normal.     Nose: Nose normal. No congestion or rhinorrhea.     Mouth/Throat:     Mouth: Mucous membranes are moist.  Eyes:     General:        Right eye: No discharge.        Left eye: No discharge.     Extraocular Movements: Extraocular movements intact.     Conjunctiva/sclera: Conjunctivae normal.     Pupils: Pupils are equal, round, and reactive to light.  Cardiovascular:     Rate and Rhythm: Normal rate and regular rhythm.     Heart sounds: No murmur heard.   Pulmonary:     Effort: Pulmonary effort is normal. No respiratory distress.     Breath sounds: Normal breath sounds. No wheezing, rhonchi or rales.  Abdominal:     General: Abdomen is flat. Bowel sounds are normal.  Musculoskeletal:     Cervical back: Normal range of motion and neck supple.  Skin:    General: Skin is warm and dry.     Capillary  Refill: Capillary refill takes less than 2 seconds.  Neurological:     General: No focal deficit present.     Mental Status: He is alert and oriented to person, place, and time.  Psychiatric:        Mood and Affect: Mood normal.        Behavior: Behavior normal.        Thought Content: Thought content normal.        Judgment: Judgment normal.     Results for orders placed or performed in visit on 07/13/20  HgB A1c  Result Value Ref Range   Hgb A1c MFr Bld 5.2 4.8 - 5.6 %   Est. average glucose Bld gHb Est-mCnc 103 mg/dL      Assessment & Plan:   Problem List Items Addressed This Visit   None   Visit Diagnoses    Encounter to establish care    -  Primary   Elevated hemoglobin A1c       Relevant Orders   HgB A1c (Completed)       Follow up plan: Return in about 6 months (around 01/13/2021) for HTN, HLD, DM2 FU.   A total of 30 minutes were spent  on this encounter today.  When total time is documented, this includes both the face-to-face and non-face-to-face time personally spent before, during and after the visit on the date of the encounter.

## 2020-07-13 ENCOUNTER — Other Ambulatory Visit: Payer: Self-pay

## 2020-07-13 ENCOUNTER — Ambulatory Visit: Payer: 59 | Admitting: Nurse Practitioner

## 2020-07-13 ENCOUNTER — Encounter: Payer: Self-pay | Admitting: Nurse Practitioner

## 2020-07-13 VITALS — BP 113/64 | HR 97 | Temp 98.7°F | Ht 70.83 in | Wt 169.0 lb

## 2020-07-13 DIAGNOSIS — Z7689 Persons encountering health services in other specified circumstances: Secondary | ICD-10-CM

## 2020-07-13 DIAGNOSIS — R7309 Other abnormal glucose: Secondary | ICD-10-CM

## 2020-07-13 MED ORDER — XARELTO 10 MG PO TABS: ORAL_TABLET | ORAL | 1 refills | Status: DC

## 2020-07-14 LAB — HEMOGLOBIN A1C
Est. average glucose Bld gHb Est-mCnc: 103 mg/dL
Hgb A1c MFr Bld: 5.2 % (ref 4.8–5.6)

## 2021-01-12 NOTE — Progress Notes (Deleted)
   There were no vitals taken for this visit.   Subjective:    Patient ID: Duayne Brideau, male    DOB: [birth year], 30 y.o.   MRN: [id number]  HPI: Rashaud Ybarbo is a 30 y.o. male  No chief complaint on file.   Relevant past medical, surgical, family and social history reviewed and updated as indicated. Interim medical history since our last visit reviewed. Allergies and medications reviewed and updated.  Review of Systems  Per HPI unless specifically indicated above     Objective:    There were no vitals taken for this visit.  Wt Readings from Last 3 Encounters:  07/13/20 169 lb (76.7 kg)  05/21/20 169 lb 3.2 oz (76.7 kg)    Physical Exam  Results for orders placed or performed in visit on 07/13/20  HgB A1c  Result Value Ref Range   Hgb A1c MFr Bld 5.2 4.8 - 5.6 %   Est. average glucose Bld gHb Est-mCnc 103 mg/dL      Assessment & Plan:   Problem List Items Addressed This Visit       Cardiovascular and Mediastinum   Bilateral pulmonary embolism (HCC)     Other   Iron deficiency anemia, unspecified - Primary     Follow up plan: No follow-ups on file.

## 2021-01-13 ENCOUNTER — Ambulatory Visit: Payer: Self-pay | Admitting: Nurse Practitioner

## 2021-01-13 DIAGNOSIS — D509 Iron deficiency anemia, unspecified: Secondary | ICD-10-CM

## 2021-01-13 DIAGNOSIS — I2699 Other pulmonary embolism without acute cor pulmonale: Secondary | ICD-10-CM

## 2021-01-19 ENCOUNTER — Ambulatory Visit: Payer: Self-pay

## 2021-01-26 ENCOUNTER — Ambulatory Visit: Payer: Self-pay

## 2021-03-22 ENCOUNTER — Encounter: Payer: Self-pay | Admitting: Nurse Practitioner

## 2021-03-22 ENCOUNTER — Other Ambulatory Visit: Payer: Self-pay

## 2021-03-22 ENCOUNTER — Ambulatory Visit (INDEPENDENT_AMBULATORY_CARE_PROVIDER_SITE_OTHER): Payer: Self-pay | Admitting: Nurse Practitioner

## 2021-03-22 VITALS — BP 110/65 | HR 66 | Temp 98.7°F | Ht 71.0 in | Wt 166.4 lb

## 2021-03-22 DIAGNOSIS — R0989 Other specified symptoms and signs involving the circulatory and respiratory systems: Secondary | ICD-10-CM

## 2021-03-22 MED ORDER — AMOXICILLIN-POT CLAVULANATE 875-125 MG PO TABS: 1.0000 | ORAL_TABLET | Freq: Two times a day (BID) | ORAL | 0 refills | Status: DC

## 2021-03-22 NOTE — Progress Notes (Signed)
Acute Office Visit  Subjective:    Patient ID: Matthew Bass, male    DOB: [birth year], 31 y.o.   MRN: [id number]  Chief Complaint  Patient presents with   Cough    Patient states he has had nasal, chest congestion and a cough for the past 3 weeks. Patient states the nasal congestion has completely cleared up. Patient states it has now lead down into to chest congestion. Patient states he has had some discolored (dark green) mucus for about a week and then this morning it scared him as it was chunky. Patient states he has tried a lot of hot teas and hot showers. Patient states he whole family was recently tested for covid and flu and everyone tested negative.    chest congestion   Nasal Congestion    HPI Patient is in today for chest congestion and cough that has been going on for 3 weeks.  UPPER RESPIRATORY TRACT INFECTION  Worst symptom: chest congestion  Fever: yes - first week of sickness Cough: yes Shortness of breath: no Wheezing: no Chest pain: no Chest tightness: yes Chest congestion: yes Nasal congestion: yes - at first, cleared up now Runny nose: no Post nasal drip: yes Sneezing: yes Sore throat: yes Swollen glands: no Sinus pressure: no Headache: no Face pain: no Toothache: no Ear pain: no  Ear pressure: no  Eyes red/itching:no Eye drainage/crusting: no  Vomiting: no Rash: no Fatigue: yes Sick contacts: yes - kids Strep contacts: no  Context: fluctuating Recurrent sinusitis: no Relief with OTC cold/cough medications: no  Treatments attempted: hot tea    Past Medical History:  Diagnosis Date   Allergy    Clotting disorder ([place])     History reviewed. No pertinent surgical history.  Family History  Problem Relation Age of Onset   Clotting disorder Mother    Clotting disorder Maternal Uncle    Clotting disorder Maternal Grandmother    Cancer Maternal Grandmother        Skin   COPD Maternal Grandfather    Cancer Paternal Grandfather         Lung    Social History   Socioeconomic History   Marital status: Single    Spouse name: Not on file   Number of children: Not on file   Years of education: Not on file   Highest education level: Not on file  Occupational History   Not on file  Tobacco Use   Smoking status: Never   Smokeless tobacco: Never  Vaping Use   Vaping Use: Never used  Substance and Sexual Activity   Alcohol use: Not Currently   Drug use: Never   Sexual activity: Yes    Partners: Female  Other Topics Concern   Not on file  Social History Narrative   Not on file   Social Determinants of Health   Financial Resource Strain: Not on file  Food Insecurity: Not on file  Transportation Needs: Not on file  Physical Activity: Not on file  Stress: Not on file  Social Connections: Not on file  Intimate Partner Violence: Not on file    Outpatient Medications Prior to Visit  Medication Sig Dispense Refill   rivaroxaban (XARELTO) 20 MG TABS tablet Take by mouth.     blood glucose meter kit and supplies Dispense based on patient and insurance preference. Check fasting glucose in am before meal and glucose before meals up to three times daily as directed. (FOR ICD-10 E10.9, E11.9). (Patient not taking: Reported on  03/22/2021) 1 each 0   Blood Glucose Monitoring Suppl (ACCU-CHEK AVIVA PLUS) w/Device KIT 1 each by Does not apply route daily at 6 (six) AM. (Patient not taking: Reported on 03/22/2021) 1 kit 0   Blood Glucose Monitoring Suppl (ACCU-CHEK GUIDE ME) w/Device KIT Check blood glucopse fasting in the am and q before every meal up to 4 times daily for diabetes mellitus type 2. (Patient not taking: Reported on 03/22/2021) 1 kit 0   glucose blood (ACCU-CHEK AVIVA PLUS) test strip Use daily PRN (Patient not taking: Reported on 03/22/2021) 100 each 12   Lancets (ACCU-CHEK MULTICLIX) lancets Check daily PRN (Patient not taking: Reported on 03/22/2021) 100 each 12   XARELTO 10 MG TABS tablet SMARTSIG:1 Tablet(s) By  Mouth Every Evening 90 tablet 1   XARELTO 15 MG TABS tablet Take 15 mg by mouth 2 (two) times daily.     No facility-administered medications prior to visit.    Allergies  Allergen Reactions   Other     Pollen    Review of Systems  Constitutional:  Positive for fatigue and fever (during first week of illness).  HENT:  Positive for postnasal drip, sneezing and sore throat. Negative for congestion, rhinorrhea and sinus pressure.   Eyes: Negative.   Respiratory:  Positive for cough (productive) and chest tightness. Negative for shortness of breath.   Cardiovascular: Negative.   Gastrointestinal: Negative.   Endocrine: Negative.   Genitourinary: Negative.   Musculoskeletal:  Positive for myalgias.  Skin: Negative.   Neurological: Negative.   Psychiatric/Behavioral: Negative.        Objective:    Physical Exam Vitals and nursing note reviewed.  Constitutional:      General: He is not in acute distress.    Appearance: Normal appearance.  HENT:     Head: Normocephalic.     Right Ear: Tympanic membrane, ear canal and external ear normal.     Left Ear: Tympanic membrane, ear canal and external ear normal.  Eyes:     Conjunctiva/sclera: Conjunctivae normal.  Cardiovascular:     Rate and Rhythm: Normal rate and regular rhythm.     Pulses: Normal pulses.     Heart sounds: Normal heart sounds.  Pulmonary:     Effort: Pulmonary effort is normal.     Breath sounds: Normal breath sounds.  Musculoskeletal:     Cervical back: Normal range of motion.  Skin:    General: Skin is warm.  Neurological:     General: No focal deficit present.     Mental Status: He is alert and oriented to person, place, and time.  Psychiatric:        Mood and Affect: Mood normal.        Behavior: Behavior normal.        Thought Content: Thought content normal.        Judgment: Judgment normal.    BP 110/65    Pulse 66    Temp 98.7 F (37.1 C) (Oral)    Ht _0  (1.803 m)    Wt 166 lb 6.4 oz (75.5  kg)    SpO2 96%    BMI 23.21 kg/m  Wt Readings from Last 3 Encounters:  03/22/21 166 lb 6.4 oz (75.5 kg)  07/13/20 169 lb (76.7 kg)  05/21/20 169 lb 3.2 oz (76.7 kg)    Health Maintenance Due  Topic Date Due   Pneumococcal Vaccine 77-46 Years old (1 - PCV) Never done   URINE MICROALBUMIN  Never done  TETANUS/TDAP  Never done   COVID-19 Vaccine (2 - Pfizer series) 04/26/2020   INFLUENZA VACCINE  10/11/2020    There are no preventive care reminders to display for this patient.   Lab Results  Component Value Date   TSH 1.900 05/24/2020   Lab Results  Component Value Date   WBC 10.5 05/24/2020   HGB 15.9 05/24/2020   HCT 48.6 05/24/2020   MCV 84 05/24/2020   PLT 260 05/24/2020   Lab Results  Component Value Date   NA 139 05/24/2020   K 4.2 05/24/2020   CO2 25 05/24/2020   GLUCOSE 161 (H) 05/24/2020   BUN 14 05/24/2020   CREATININE 0.87 05/24/2020   BILITOT 0.4 05/24/2020   ALKPHOS 100 05/24/2020   AST 65 (H) 05/24/2020   ALT 71 (H) 05/24/2020   PROT 6.7 05/24/2020   ALBUMIN 4.2 05/24/2020   CALCIUM 9.5 05/24/2020   EGFR 118 05/24/2020   Lab Results  Component Value Date   CHOL 181 05/24/2020   Lab Results  Component Value Date   HDL 42 05/24/2020   Lab Results  Component Value Date   LDLCALC 99 05/24/2020   Lab Results  Component Value Date   TRIG 233 (H) 05/24/2020   Lab Results  Component Value Date   CHOLHDL 4.3 05/24/2020   Lab Results  Component Value Date   HGBA1C 5.2 07/13/2020       Assessment & Plan:   Problem List Items Addressed This Visit   None Visit Diagnoses     Chest congestion    -  Primary   Ongoing x3 weeks after illness. Declines CXR. Will treat with augmentin. Can take mucinex prn. Encourage fluids, rest. F/U if not improving.         Meds ordered this encounter  Medications   amoxicillin-clavulanate (AUGMENTIN) 875-125 MG tablet    Sig: Take 1 tablet by mouth 2 (two) times daily.    Dispense:  20 tablet     Refill:  0     Charyl Dancer, NP

## 2021-03-22 NOTE — Patient Instructions (Addendum)
Guaifenesin (mucinex) - expectorant  Drink plenty of fluids Start aumgentin twice a day for 10 days

## 2021-03-24 ENCOUNTER — Ambulatory Visit: Payer: 59 | Admitting: Internal Medicine

## 2021-03-24 ENCOUNTER — Other Ambulatory Visit: Payer: Self-pay

## 2021-03-24 ENCOUNTER — Ambulatory Visit (INDEPENDENT_AMBULATORY_CARE_PROVIDER_SITE_OTHER): Payer: 59 | Admitting: Internal Medicine

## 2021-03-24 ENCOUNTER — Encounter: Payer: Self-pay | Admitting: Internal Medicine

## 2021-03-24 ENCOUNTER — Ambulatory Visit: Payer: Self-pay

## 2021-03-24 VITALS — BP 108/66 | HR 55 | Temp 98.3°F | Ht 70.98 in | Wt 172.2 lb

## 2021-03-24 DIAGNOSIS — M7989 Other specified soft tissue disorders: Secondary | ICD-10-CM

## 2021-03-24 NOTE — Progress Notes (Signed)
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   Matthew Bass

## 2021-03-24 NOTE — Telephone Encounter (Signed)
° °  Chief Complaint: Pain, swelling to left calf Symptoms: No redness, no chest pain or shortness of breath Frequency: Started 3 days ago Pertinent Negatives: Patient denies See above Disposition: [] ED /[] Urgent Care (no appt availability in office) / [] Appointment(In office/virtual)/ []  [facility]/ [] Home Care/ [] Refused Recommended Disposition /[] [facility]/ []  Follow-up with PCP Additional Notes: Irisin the practice will talk with PCP about appointment and call pt. Back. Instructed pt. To go to ED for worsening of symptoms.  Answer Assessment - Initial Assessment Questions 1. ONSET: "When did the swelling start?" (e.g., minutes, hours, days)     3 days 2. LOCATION: "What part of the leg is swollen?"  "Are both legs swollen or just one leg?"     LEFT LEG 3. SEVERITY: "How bad is the swelling?" (e.g., localized; mild, moderate, severe)  - Localized - small area of swelling localized to one leg  - MILD pedal edema - swelling limited to foot and ankle, pitting edema < 1/4 inch (6 mm) deep, rest and elevation eliminate most or all swelling  - MODERATE edema - swelling of lower leg to knee, pitting edema > 1/4 inch (6 mm) deep, rest and elevation only partially reduce swelling  - SEVERE edema - swelling extends above knee, facial or hand swelling present      Behind calf 4. REDNESS: "Does the swelling look red or infected?"     No 5. PAIN: "Is the swelling painful to touch?" If Yes, ask: "How painful is it?"   (Scale 1-10; mild, moderate or severe)     Now - 2 6. FEVER: "Do you have a fever?" If Yes, ask: "What is it, how was it measured, and when did it start?"      No 7. CAUSE: "What do you think is causing the leg swelling?"     Maybe a DVT 8. MEDICAL HISTORY: "Do you have a history of heart failure, kidney disease, liver failure, or cancer?"     nO 9. RECURRENT SYMPTOM: "Have you had leg swelling before?" If Yes, ask: "When was the last time?" "What happened  that time?"     Yes 10. OTHER SYMPTOMS: "Do you have any other symptoms?" (e.g., chest pain, difficulty breathing)       No 11. PREGNANCY: "Is there any chance you are pregnant?" "When was your last menstrual period?"       N/a  Protocols used: Leg Swelling and Edema-A-AH

## 2022-03-15 ENCOUNTER — Telehealth: Payer: Self-pay

## 2022-03-15 NOTE — Telephone Encounter (Signed)
Transition Care Management Unsuccessful Follow-up Telephone Call  Date of discharge and from where:  03/11/22, [facility] ER  Attempts:  1st Attempt  Reason for unsuccessful TCM follow-up call:  Left voice message

## 2022-03-16 NOTE — Telephone Encounter (Signed)
Transition Care Management Follow-up Telephone Call Date of discharge and from where: 03/11/22, [facility] ER How have you been since you were released from the hospital? Better Any questions or concerns? No  Items Reviewed: Did the pt receive and understand the discharge instructions provided? Yes  Medications obtained and verified? Yes  Other? No  Any new allergies since your discharge? No  Dietary orders reviewed? Yes Do you have support at home? Yes   Home Care and Equipment/Supplies: Were home health services ordered? no If so, what is the name of the agency? N/A  Has the agency set up a time to come to the patient's home? not applicable Were any new equipment or medical supplies ordered?  No What is the name of the medical supply agency? N/A Were you able to get the supplies/equipment? not applicable Do you have any questions related to the use of the equipment or supplies? No  Functional Questionnaire: (I = Independent and D = Dependent) ADLs: I  Bathing/Dressing- I  Meal Prep- I  Eating- I  Maintaining continence- I  Transferring/Ambulation- I  Managing Meds- I  Follow up appointments reviewed:  PCP Hospital f/u appt confirmed? No   Specialist Hospital f/u appt confirmed? No Are transportation arrangements needed? No  If their condition worsens, is the pt aware to call PCP or go to the Emergency Dept.? Yes Was the patient provided with contact information for the PCP's office or ED? Yes Was to pt encouraged to call back with questions or concerns? Yes

## 2022-04-05 ENCOUNTER — Encounter: Payer: Self-pay | Admitting: Nurse Practitioner

## 2022-04-18 ENCOUNTER — Encounter: Payer: Self-pay | Admitting: Nurse Practitioner

## 2022-04-18 NOTE — Progress Notes (Deleted)
There were no vitals taken for this visit.   Subjective:    Patient ID: Matthew Bass, male    DOB: 05/11/1990, 32 y.o.   MRN: [id number]  HPI: Matthew Bass is a 33 y.o. male presenting on 04/18/2022 for comprehensive medical examination. Current medical complaints include:{Blank single:19197::"none","***"}  He currently lives with: Interim Problems from his last visit: {Blank single:19197::"yes","no"}  Depression Screen done today and results listed below:     07/13/2020   11:06 AM 05/21/2020    2:24 PM  Depression screen PHQ 2/9  Decreased Interest 0 0  Down, Depressed, Hopeless 0 0  PHQ - 2 Score 0 0  Altered sleeping  0  Tired, decreased energy  0  Change in appetite  0  Feeling bad or failure about yourself   0  Trouble concentrating  0  Moving slowly or fidgety/restless  0  Suicidal thoughts  0  PHQ-9 Score  0  Difficult doing work/chores  Not difficult at all    The patient {has/does not have:19849} a history of falls. I {did/did not:19850} complete a risk assessment for falls. A plan of care for falls {was/was not:19852} documented.   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  Medications:  Current Outpatient Medications on File Prior to Visit  Medication Sig   amoxicillin-clavulanate (AUGMENTIN) 875-125 MG tablet Take 1 tablet by mouth 2 (two) times daily.   rivaroxaban (XARELTO) 20 MG TABS tablet Take by mouth.   XARELTO 15 MG TABS tablet Take 15 mg by mouth 2 (two) times daily.   No current facility-administered medications on file prior to visit.    Allergies:  Allergies  Allergen Reactions   Other     Pollen    Social History:  Social History   Socioeconomic History   Marital status: Single    Spouse name: Not on file   Number of children: Not on file   Years of education: Not on file   Highest education level: Not on file   Occupational History   Not on file  Tobacco Use   Smoking status: Never   Smokeless tobacco: Never  Vaping Use   Vaping Use: Never used  Substance and Sexual Activity   Alcohol use: Not Currently   Drug use: Never   Sexual activity: Yes    Partners: Female  Other Topics Concern   Not on file  Social History Narrative   Not on file   Social Determinants of Health   Financial Resource Strain: Not on file  Food Insecurity: Not on file  Transportation Needs: Not on file  Physical Activity: Not on file  Stress: Not on file  Social Connections: Not on file  Intimate Partner Violence: Not on file   Social History   Tobacco Use  Smoking Status Never  Smokeless Tobacco Never   Social History   Substance and Sexual Activity  Alcohol Use Not Currently    Family History:  Family History  Problem Relation Age of Onset   Clotting disorder Mother    Clotting disorder Maternal Uncle    Clotting disorder Maternal Grandmother    Cancer Maternal Grandmother        Skin   COPD Maternal Grandfather    Cancer Paternal Grandfather        Lung    Past medical history, surgical history, medications, allergies,  family history and social history reviewed with patient today and changes made to appropriate areas of the chart.   ROS All other ROS negative except what is listed above and in the HPI.      Objective:    There were no vitals taken for this visit.  Wt Readings from Last 3 Encounters:  03/24/21 172 lb 3.2 oz (78.1 kg)  03/22/21 166 lb 6.4 oz (75.5 kg)  07/13/20 169 lb (76.7 kg)    Physical Exam  Results for orders placed or performed in visit on 07/13/20  HgB A1c  Result Value Ref Range   Hgb A1c MFr Bld 5.2 4.8 - 5.6 %   Est. average glucose Bld gHb Est-mCnc 103 mg/dL      Assessment & Plan:   Problem List Items Addressed This Visit   None    Discussed aspirin prophylaxis for myocardial infarction prevention and decision was {Blank single:19197::"it  was not indicated","made to continue ASA","made to start ASA","made to stop ASA","that we recommended ASA, and patient refused"}  LABORATORY TESTING:  Health maintenance labs ordered today as discussed above.   The natural history of prostate cancer and ongoing controversy regarding screening and potential treatment outcomes of prostate cancer has been discussed with the patient. The meaning of a false positive PSA and a false negative PSA has been discussed. He indicates understanding of the limitations of this screening test and wishes *** to proceed with screening PSA testing.   IMMUNIZATIONS:   - Tdap: Tetanus vaccination status reviewed: {tetanus status:315746}. - Influenza: {Blank single:19197::"Up to date","Administered today","Postponed to flu season","Refused","Given elsewhere"} - Pneumovax: {Blank single:19197::"Up to date","Administered today","Not applicable","Refused","Given elsewhere"} - Prevnar: {Blank single:19197::"Up to date","Administered today","Not applicable","Refused","Given elsewhere"} - COVID: {Blank single:19197::"Up to date","Administered today","Not applicable","Refused","Given elsewhere"} - HPV: {Blank single:19197::"Up to date","Administered today","Not applicable","Refused","Given elsewhere"} - Shingrix vaccine: {Blank single:19197::"Up to date","Administered today","Not applicable","Refused","Given elsewhere"}  SCREENING: - Colonoscopy: {Blank single:19197::"Up to date","Ordered today","Not applicable","Refused","Done elsewhere"}  Discussed with patient purpose of the colonoscopy is to detect colon cancer at curable precancerous or early stages   - AAA Screening: {Blank single:19197::"Up to date","Ordered today","Not applicable","Refused","Done elsewhere"}  -Hearing Test: {Blank single:19197::"Up to date","Ordered today","Not applicable","Refused","Done elsewhere"}  -Spirometry: {Blank single:19197::"Up to date","Ordered today","Not applicable","Refused","Done  elsewhere"}   PATIENT COUNSELING:    Sexuality: Discussed sexually transmitted diseases, partner selection, use of condoms, avoidance of unintended pregnancy  and contraceptive alternatives.   Advised to avoid cigarette smoking.  I discussed with the patient that most people either abstain from alcohol or drink within safe limits (<=14/week and <=4 drinks/occasion for males, <=7/weeks and <= 3 drinks/occasion for females) and that the risk for alcohol disorders and other health effects rises proportionally with the number of drinks per week and how often a drinker exceeds daily limits.  Discussed cessation/primary prevention of drug use and availability of treatment for abuse.   Diet: Encouraged to adjust caloric intake to maintain  or achieve ideal body weight, to reduce intake of dietary saturated fat and total fat, to limit sodium intake by avoiding high sodium foods and not adding table salt, and to maintain adequate dietary potassium and calcium preferably from fresh fruits, vegetables, and low-fat dairy products.    stressed the importance of regular exercise  Injury prevention: Discussed safety belts, safety helmets, smoke detector, smoking near bedding or upholstery.   Dental health: Discussed importance of regular tooth brushing, flossing, and dental visits.   Follow up plan: NEXT PREVENTATIVE PHYSICAL DUE IN 1 YEAR. No follow-ups on file.

## 2022-07-13 ENCOUNTER — Ambulatory Visit: Payer: 59 | Admitting: Nurse Practitioner

## 2022-07-13 ENCOUNTER — Encounter: Payer: Self-pay | Admitting: Nurse Practitioner

## 2022-07-13 VITALS — BP 101/59 | HR 49 | Temp 97.7°F | Ht 70.1 in | Wt 170.4 lb

## 2022-07-13 DIAGNOSIS — Z114 Encounter for screening for human immunodeficiency virus [HIV]: Secondary | ICD-10-CM

## 2022-07-13 DIAGNOSIS — I2699 Other pulmonary embolism without acute cor pulmonale: Secondary | ICD-10-CM

## 2022-07-13 DIAGNOSIS — T7840XA Allergy, unspecified, initial encounter: Secondary | ICD-10-CM | POA: Diagnosis not present

## 2022-07-13 DIAGNOSIS — Z23 Encounter for immunization: Secondary | ICD-10-CM | POA: Diagnosis not present

## 2022-07-13 DIAGNOSIS — Z0000 Encounter for general adult medical examination without abnormal findings: Secondary | ICD-10-CM | POA: Diagnosis not present

## 2022-07-13 DIAGNOSIS — Z1159 Encounter for screening for other viral diseases: Secondary | ICD-10-CM

## 2022-07-13 DIAGNOSIS — Z136 Encounter for screening for cardiovascular disorders: Secondary | ICD-10-CM

## 2022-07-13 LAB — URINALYSIS, ROUTINE W REFLEX MICROSCOPIC
Bilirubin, UA: NEGATIVE
Glucose, UA: NEGATIVE
Ketones, UA: NEGATIVE
Leukocytes,UA: NEGATIVE
Nitrite, UA: NEGATIVE
Protein,UA: NEGATIVE
RBC, UA: NEGATIVE
Specific Gravity, UA: 1.015 (ref 1.005–1.030)
Urobilinogen, Ur: 0.2 mg/dL (ref 0.2–1.0)
pH, UA: 8 — ABNORMAL HIGH (ref 5.0–7.5)

## 2022-07-13 NOTE — Assessment & Plan Note (Signed)
Followed by Hematology.  On Xerelto for long term treatment.

## 2022-07-13 NOTE — Progress Notes (Signed)
BP (!) 101/59   Pulse (!) 49   Temp 97.7 F (36.5 C) (Oral)   Ht 5' 10.1" (1.781 m)   Wt 170 lb 6.4 oz (77.3 kg)   SpO2 98%   BMI 24.38 kg/m    Subjective:    Patient ID: Matthew Bass, male    DOB: 1991-03-02, 32 y.o.   MRN: [id number]  HPI: Matthew Bass is a 32 y.o. male presenting on 07/13/2022 for comprehensive medical examination. Current medical complaints include: allergies  He currently lives with: Interim Problems from his last visit: no  ALLERGIES Duration: chronic Runny nose: yes "clear Nasal congestion: yes Nasal itching:  burning Sneezing: yes Eye swelling, itching or discharge: yes Post nasal drip: yes Cough: yes Sinus pressure: yes  Ear pain: no bilateral Ear pressure: no bilateral Fever: no low grade Symptoms occur seasonally: yes Symptoms occur perenially: no Satisfied with current treatment: no Allergist evaluation in past: no Allergen injection immunotherapy: no Recurrent sinus infections: no ENT evaluation in past: no Known environmental allergy: no Indoor pets: no History of asthma: no Current allergy medications: none Treatments attempted: none   Depression Screen done today and results listed below:     07/13/2022    9:50 AM 07/13/2020   11:06 AM 05/21/2020    2:24 PM  Depression screen PHQ 2/9  Decreased Interest 0 0 0  Down, Depressed, Hopeless 0 0 0  PHQ - 2 Score 0 0 0  Altered sleeping 1  0  Tired, decreased energy 0  0  Change in appetite 0  0  Feeling bad or failure about yourself  0  0  Trouble concentrating 0  0  Moving slowly or fidgety/restless 0  0  Suicidal thoughts 0  0  PHQ-9 Score 1  0  Difficult doing work/chores Not difficult at all  Not difficult at all    The patient does not have a history of falls. I did complete a risk assessment for falls. A plan of care for falls was documented.   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 No pertinent surgical history.  Medications:  Current Outpatient Medications on File Prior to Visit  Medication Sig   loratadine (CLARITIN) 10 MG tablet Take 10 mg by mouth daily.   XARELTO 15 MG TABS tablet Take 15 mg by mouth daily with supper.   No current facility-administered medications on file prior to visit.    Allergies:  Allergies  Allergen Reactions   Other     Pollen    Social History:  Social History   Socioeconomic History   Marital status: Single    Spouse name: Not on file   Number of children: Not on file   Years of education: Not on file   Highest education level: Not on file  Occupational History   Not on file  Tobacco Use   Smoking status: Never   Smokeless tobacco: Never  Vaping Use   Vaping Use: Never used  Substance and Sexual Activity   Alcohol use: Not Currently   Drug use: Never   Sexual activity: Yes    Partners: Female  Other Topics Concern   Not on file  Social History Narrative   Not on file   Social Determinants of Health   Financial Resource Strain: Not on file  Food Insecurity: Not on file  Transportation Needs: Not on  file  Physical Activity: Not on file  Stress: Not on file  Social Connections: Not on file  Intimate Partner Violence: Not on file   Social History   Tobacco Use  Smoking Status Never  Smokeless Tobacco Never   Social History   Substance and Sexual Activity  Alcohol Use Not Currently    Family History:  Family History  Problem Relation Age of Onset   Clotting disorder Mother    Clotting disorder Maternal Uncle    Clotting disorder Maternal Grandmother    Cancer Maternal Grandmother        Skin   COPD Maternal Grandfather    Cancer Paternal Grandfather        Lung    Past medical history, surgical history, medications, allergies, family history and social history reviewed with patient today and changes made to appropriate areas of the chart.   Review of Systems  HENT:   Positive for congestion, sinus pain and sore throat. Negative for ear discharge and ear pain.    All other ROS negative except what is listed above and in the HPI.      Objective:    BP (!) 101/59   Pulse (!) 49   Temp 97.7 F (36.5 C) (Oral)   Ht 5' 10.1" (1.781 m)   Wt 170 lb 6.4 oz (77.3 kg)   SpO2 98%   BMI 24.38 kg/m   Wt Readings from Last 3 Encounters:  07/13/22 170 lb 6.4 oz (77.3 kg)  03/24/21 172 lb 3.2 oz (78.1 kg)  03/22/21 166 lb 6.4 oz (75.5 kg)    Physical Exam Vitals and nursing note reviewed.  Constitutional:      General: He is not in acute distress.    Appearance: Normal appearance. He is not ill-appearing, toxic-appearing or diaphoretic.  HENT:     Head: Normocephalic.     Right Ear: Tympanic membrane, ear canal and external ear normal.     Left Ear: Tympanic membrane, ear canal and external ear normal.     Nose: Congestion and rhinorrhea present.     Mouth/Throat:     Mouth: Mucous membranes are moist.     Pharynx: Posterior oropharyngeal erythema present. No oropharyngeal exudate.  Eyes:     General:        Right eye: No discharge.        Left eye: No discharge.     Extraocular Movements: Extraocular movements intact.     Conjunctiva/sclera: Conjunctivae normal.     Pupils: Pupils are equal, round, and reactive to light.  Cardiovascular:     Rate and Rhythm: Normal rate and regular rhythm.     Heart sounds: No murmur heard. Pulmonary:     Effort: Pulmonary effort is normal. No respiratory distress.     Breath sounds: Normal breath sounds. No wheezing, rhonchi or rales.  Abdominal:     General: Abdomen is flat. Bowel sounds are normal. There is no distension.     Palpations: Abdomen is soft.     Tenderness: There is no abdominal tenderness. There is no guarding.  Musculoskeletal:     Cervical back: Normal range of motion and neck supple.  Skin:    General: Skin is warm and dry.     Capillary Refill: Capillary refill takes less than 2  seconds.  Neurological:     General: No focal deficit present.     Mental Status: He is alert and oriented to person, place, and time.     Cranial  Nerves: No cranial nerve deficit.     Motor: No weakness.     Deep Tendon Reflexes: Reflexes normal.  Psychiatric:        Mood and Affect: Mood normal.        Behavior: Behavior normal.        Thought Content: Thought content normal.        Judgment: Judgment normal.     Results for orders placed or performed in visit on 07/13/20  HgB A1c  Result Value Ref Range   Hgb A1c MFr Bld 5.2 4.8 - 5.6 %   Est. average glucose Bld gHb Est-mCnc 103 mg/dL      Assessment & Plan:   Problem List Items Addressed This Visit       Cardiovascular and Mediastinum   Bilateral pulmonary embolism (HCC)    Followed by Hematology.  On Xerelto for long term treatment.       Other Visit Diagnoses     Annual physical exam    -  Primary   Health maintenance reviewed during visit today.  Labs ordered.  TD given.   Relevant Orders   TSH   PSA   CBC with Differential/Platelet   Comprehensive metabolic panel   Urinalysis, Routine w reflex microscopic   HIV Antibody (routine testing w rflx)   Hepatitis C Antibody   Allergy, initial encounter       Recommend Zyrtec and flonase. Recommend using the Neti pot.  Referral placed for allergy.   Relevant Orders   Ambulatory referral to Allergy   Screening for HIV (human immunodeficiency virus)       Relevant Orders   HIV Antibody (routine testing w rflx)   Screening for ischemic heart disease       Relevant Orders   Lipid panel   Encounter for hepatitis C screening test for low risk patient       Relevant Orders   Hepatitis C Antibody   Need for influenza vaccination       Need for Tdap vaccination       Relevant Orders   Tdap vaccine greater than or equal to 7yo IM (Completed)        Discussed aspirin prophylaxis for myocardial infarction prevention and decision was it was not  indicated  LABORATORY TESTING:  Health maintenance labs ordered today as discussed above.     IMMUNIZATIONS:   - Tdap: Tetanus vaccination status reviewed: Given today. - Influenza: Up to date - Pneumovax: Not applicable - Prevnar: Not applicable - COVID: Not applicable - HPV: Up to date - Shingrix vaccine: Not applicable  SCREENING: - Colonoscopy: Not applicable  Discussed with patient purpose of the colonoscopy is to detect colon cancer at curable precancerous or early stages   - AAA Screening: Not applicable  -Hearing Test: Not applicable  -Spirometry: Not applicable   PATIENT COUNSELING:    Sexuality: Discussed sexually transmitted diseases, partner selection, use of condoms, avoidance of unintended pregnancy  and contraceptive alternatives.   Advised to avoid cigarette smoking.  I discussed with the patient that most people either abstain from alcohol or drink within safe limits (<=14/week and <=4 drinks/occasion for males, <=7/weeks and <= 3 drinks/occasion for females) and that the risk for alcohol disorders and other health effects rises proportionally with the number of drinks per week and how often a drinker exceeds daily limits.  Discussed cessation/primary prevention of drug use and availability of treatment for abuse.   Diet: Encouraged to adjust caloric intake to maintain  or achieve ideal body weight, to reduce intake of dietary saturated fat and total fat, to limit sodium intake by avoiding high sodium foods and not adding table salt, and to maintain adequate dietary potassium and calcium preferably from fresh fruits, vegetables, and low-fat dairy products.    stressed the importance of regular exercise  Injury prevention: Discussed safety belts, safety helmets, smoke detector, smoking near bedding or upholstery.   Dental health: Discussed importance of regular tooth brushing, flossing, and dental visits.   Follow up plan: NEXT PREVENTATIVE PHYSICAL DUE IN 1  YEAR. Return in about 1 year (around 07/13/2023) for Physical and Fasting labs.

## 2022-07-14 LAB — COMPREHENSIVE METABOLIC PANEL
ALT: 58 IU/L — ABNORMAL HIGH (ref 0–44)
AST: 38 IU/L (ref 0–40)
Albumin/Globulin Ratio: 2.4 — ABNORMAL HIGH (ref 1.2–2.2)
Albumin: 4.8 g/dL (ref 4.1–5.1)
Alkaline Phosphatase: 49 IU/L (ref 44–121)
BUN/Creatinine Ratio: 18 (ref 9–20)
BUN: 17 mg/dL (ref 6–20)
Bilirubin Total: 0.6 mg/dL (ref 0.0–1.2)
CO2: 23 mmol/L (ref 20–29)
Calcium: 9.6 mg/dL (ref 8.7–10.2)
Chloride: 103 mmol/L (ref 96–106)
Creatinine, Ser: 0.96 mg/dL (ref 0.76–1.27)
Globulin, Total: 2 g/dL (ref 1.5–4.5)
Glucose: 90 mg/dL (ref 70–99)
Potassium: 4.5 mmol/L (ref 3.5–5.2)
Sodium: 141 mmol/L (ref 134–144)
Total Protein: 6.8 g/dL (ref 6.0–8.5)
eGFR: 106 mL/min/{1.73_m2} (ref >59)

## 2022-07-14 LAB — LIPID PANEL
Chol/HDL Ratio: 2.4 ratio (ref 0.0–5.0)
Cholesterol, Total: 154 mg/dL (ref 100–199)
HDL: 64 mg/dL (ref >39)
LDL Chol Calc (NIH): 75 mg/dL (ref 0–99)
Triglycerides: 78 mg/dL (ref 0–149)
VLDL Cholesterol Cal: 15 mg/dL (ref 5–40)

## 2022-07-14 LAB — CBC WITH DIFFERENTIAL/PLATELET
Basophils Absolute: 0.1 10*3/uL (ref 0.0–0.2)
Basos: 2 %
EOS (ABSOLUTE): 0.2 10*3/uL (ref 0.0–0.4)
Eos: 3 %
Hematocrit: 45.7 % (ref 37.5–51.0)
Hemoglobin: 15.5 g/dL (ref 13.0–17.7)
Immature Grans (Abs): 0 10*3/uL (ref 0.0–0.1)
Immature Granulocytes: 0 %
Lymphocytes Absolute: 1.3 10*3/uL (ref 0.7–3.1)
Lymphs: 25 %
MCH: 30.7 pg (ref 26.6–33.0)
MCHC: 33.9 g/dL (ref 31.5–35.7)
MCV: 91 fL (ref 79–97)
Monocytes Absolute: 0.5 10*3/uL (ref 0.1–0.9)
Monocytes: 10 %
Neutrophils Absolute: 3.2 10*3/uL (ref 1.4–7.0)
Neutrophils: 60 %
Platelets: 214 10*3/uL (ref 150–450)
RBC: 5.05 x10E6/uL (ref 4.14–5.80)
RDW: 12.1 % (ref 11.6–15.4)
WBC: 5.3 10*3/uL (ref 3.4–10.8)

## 2022-07-14 LAB — HEPATITIS C ANTIBODY: Hep C Virus Ab: NONREACTIVE

## 2022-07-14 LAB — HIV ANTIBODY (ROUTINE TESTING W REFLEX): HIV Screen 4th Generation wRfx: NONREACTIVE

## 2022-07-14 LAB — PSA: Prostate Specific Ag, Serum: 1.3 ng/mL (ref 0.0–4.0)

## 2022-07-14 LAB — TSH: TSH: 1.66 u[IU]/mL (ref 0.450–4.500)

## 2022-07-14 NOTE — Progress Notes (Signed)
HI Matthew Bass. It was nice to see you yesterday.  Your lab work looks good.  No concerns at this time. Continue with your current medication regimen.  Follow up as discussed.  Please let me know if you have any questions.

## 2022-11-16 IMAGING — CR DG CERVICAL SPINE COMPLETE 4+V
1 series · 5 of 5 positions shown · non-contrast
Comparison: None.

CLINICAL DATA: Cervical neck pain for years.  Muscle spasm.

EXAM:
CERVICAL SPINE - COMPLETE 4+ VIEW

[Series 1: dg cervical spine complete · 0.14mm/px · 5 of 5 slices shown]
[im 1/5]
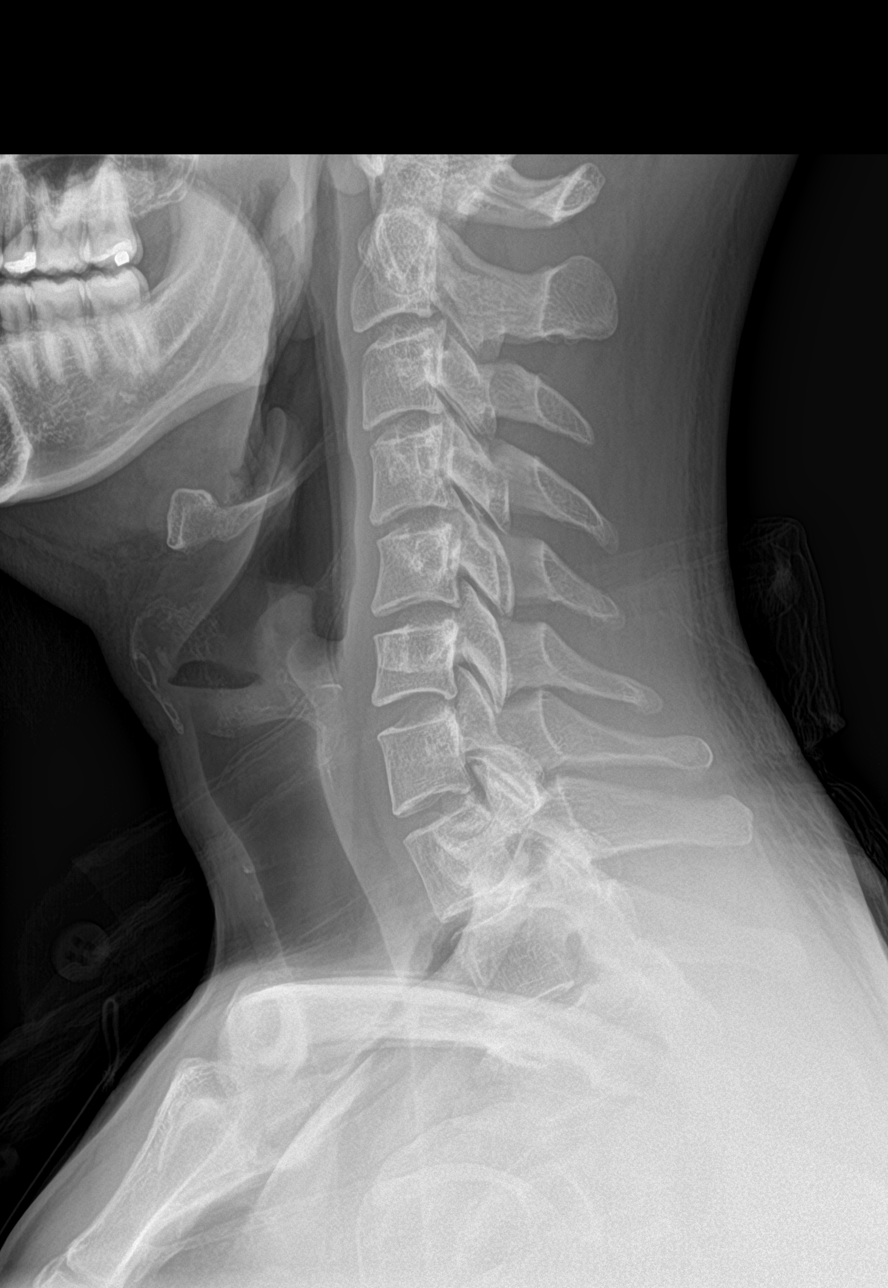
[im 2/5]
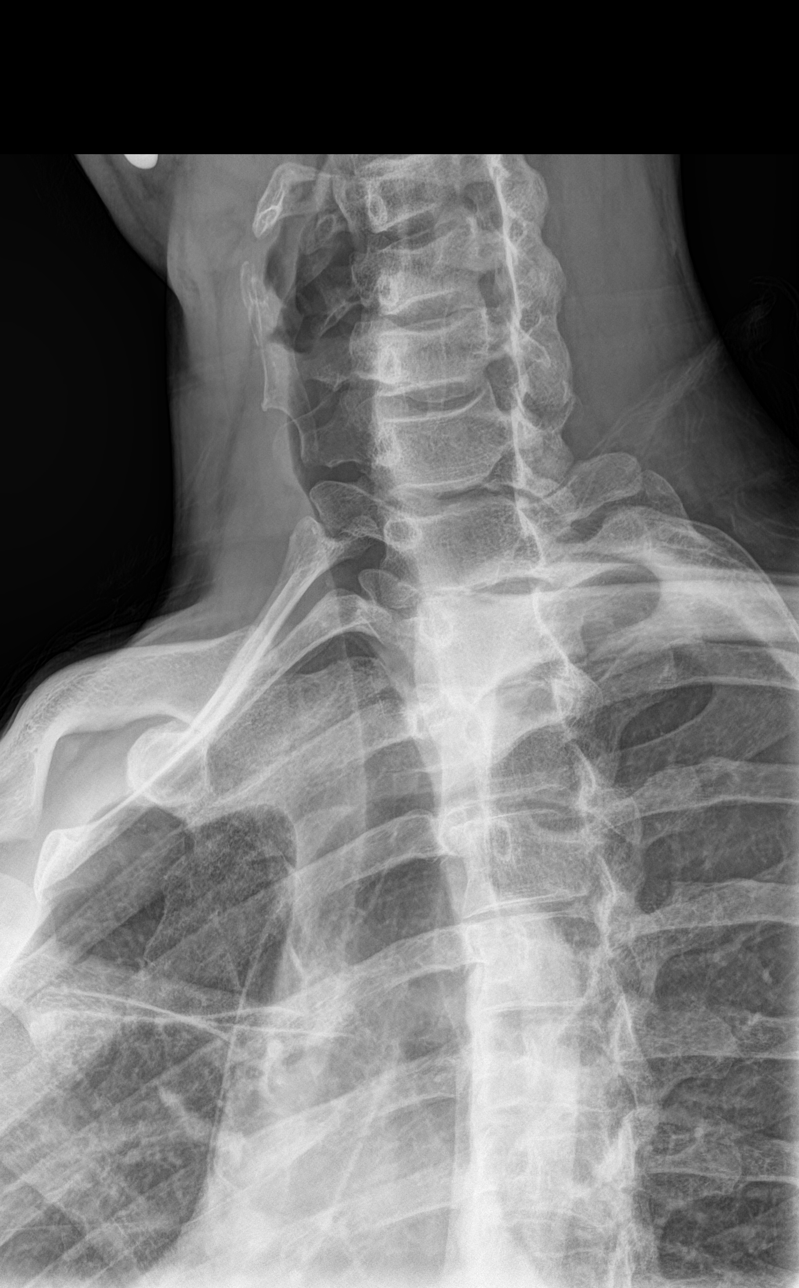
[im 3/5]
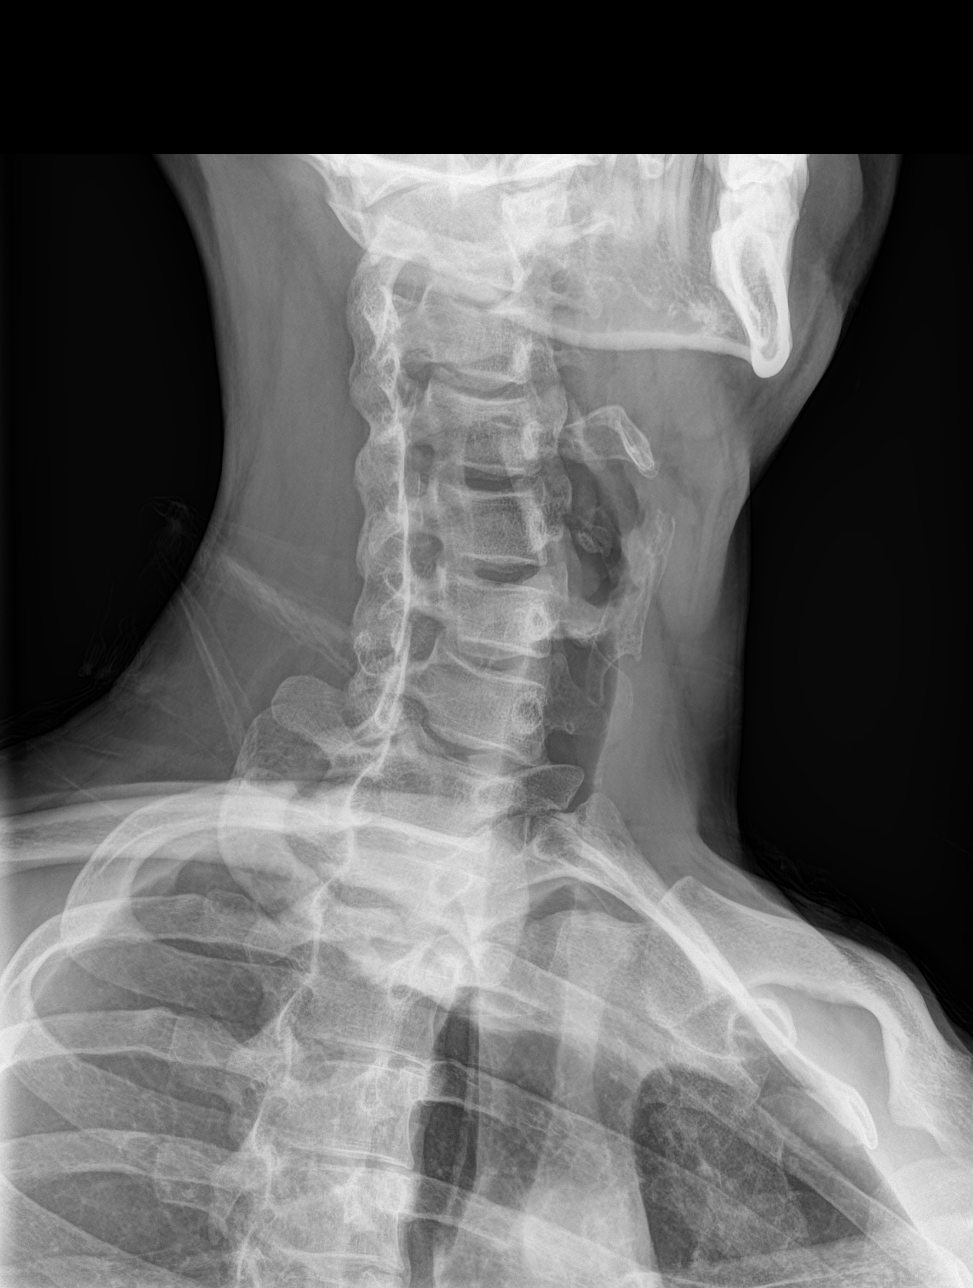
[im 4/5]
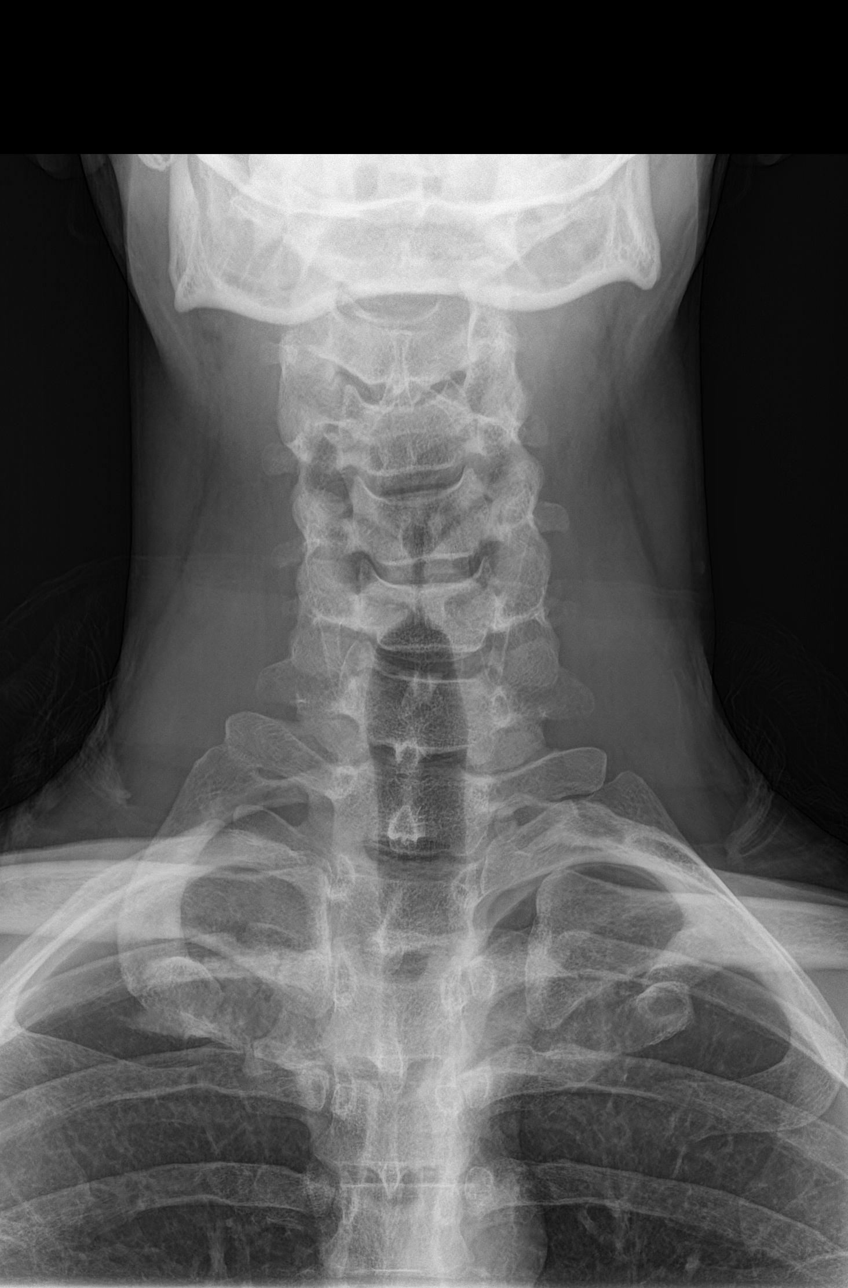
[im 5/5]
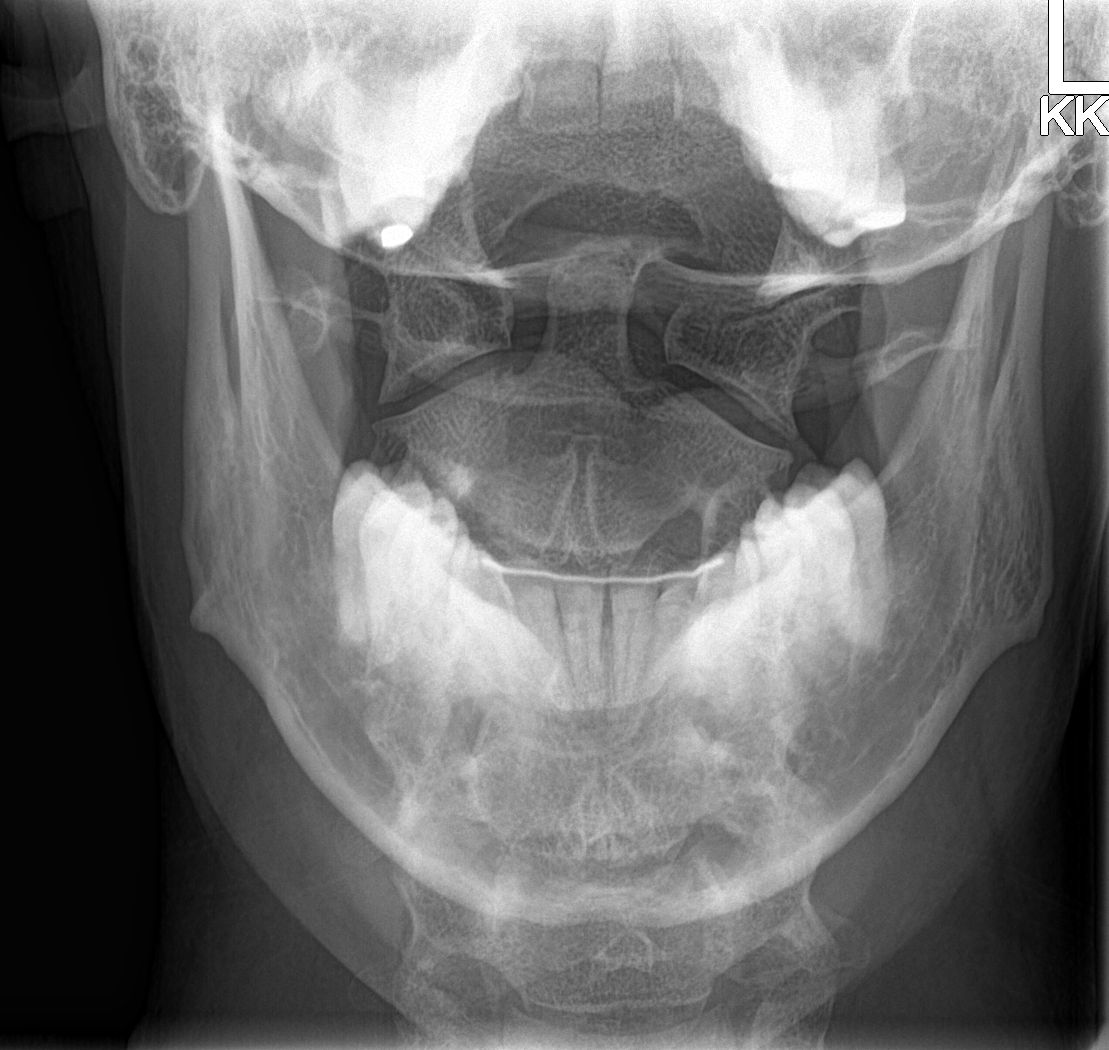

[5 of 5 positions shown; findings below may reference images not displayed]

FINDINGS: Cervical spine alignment is maintained. Vertebral body heights and
intervertebral disc spaces are preserved. The dens is intact.
Posterior elements appear well-aligned. There is no evidence of
fracture, focal bone lesion or bony destruction. No prevertebral
soft tissue edema.
IMPRESSION: Negative cervical spine radiographs.

## 2023-07-16 ENCOUNTER — Ambulatory Visit (INDEPENDENT_AMBULATORY_CARE_PROVIDER_SITE_OTHER): Payer: Self-pay | Admitting: Nurse Practitioner

## 2023-07-16 ENCOUNTER — Encounter: Payer: Self-pay | Admitting: Nurse Practitioner

## 2023-07-16 VITALS — BP 117/76 | HR 54 | Temp 97.4°F | Ht 71.0 in | Wt 177.8 lb

## 2023-07-16 DIAGNOSIS — Z0000 Encounter for general adult medical examination without abnormal findings: Secondary | ICD-10-CM | POA: Diagnosis not present

## 2023-07-16 DIAGNOSIS — T7840XA Allergy, unspecified, initial encounter: Secondary | ICD-10-CM

## 2023-07-16 DIAGNOSIS — Z1330 Encounter for screening examination for mental health and behavioral disorders, unspecified: Secondary | ICD-10-CM

## 2023-07-16 DIAGNOSIS — Z136 Encounter for screening for cardiovascular disorders: Secondary | ICD-10-CM

## 2023-07-16 DIAGNOSIS — I2699 Other pulmonary embolism without acute cor pulmonale: Secondary | ICD-10-CM

## 2023-07-16 MED ORDER — XARELTO 15 MG PO TABS
15.0000 mg | ORAL_TABLET | Freq: Every day | ORAL | 1 refills | Status: AC
Start: 2023-07-16 — End: ?

## 2023-07-16 NOTE — Addendum Note (Signed)
 Addended by: Aileen Alexanders on: 07/16/2023 11:46 AM   Modules accepted: Level of Service

## 2023-07-16 NOTE — Assessment & Plan Note (Signed)
 Followed by Hematology.  On Xerelto.  Refilled for patient during visit today.

## 2023-07-16 NOTE — Progress Notes (Addendum)
 BP 117/76   Pulse (!) 54   Temp (!) 97.4 F (36.3 C) (Oral)   Ht 5\' 11"  (1.803 m)   Wt 177 lb 12.8 oz (80.6 kg)   SpO2 97%   BMI 24.80 kg/m    Subjective:    Patient ID: Matthew Bass, male    DOB: 06-10-1990, 34 y.o.   MRN: [id number]  HPI: Matthew Bass is a 33 y.o. male presenting on 07/16/2023 for comprehensive medical examination. Current medical complaints include: allergy referral   He currently lives with: Interim Problems from his last visit: no  Continues to follow up with Hematology.   Depression Screen done today and results listed below:     07/16/2023    8:42 AM 07/13/2022    9:50 AM 07/13/2020   11:06 AM 05/21/2020    2:24 PM  Depression screen PHQ 2/9  Decreased Interest 0 0 0 0  Down, Depressed, Hopeless 0 0 0 0  PHQ - 2 Score 0 0 0 0  Altered sleeping 0 1  0  Tired, decreased energy 0 0  0  Change in appetite 0 0  0  Feeling bad or failure about yourself  0 0  0  Trouble concentrating 0 0  0  Moving slowly or fidgety/restless 0 0  0  Suicidal thoughts 0 0  0  PHQ-9 Score 0 1  0  Difficult doing work/chores Not difficult at all Not difficult at all  Not difficult at all    The patient does not have a history of falls. I did complete a risk assessment for falls. A plan of care for falls was documented.   Past Medical History:  Past Medical History:  Diagnosis Date  . Allergy   . 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 No pertinent surgical history.  Medications:  Current Outpatient Medications on File Prior to Visit  Medication Sig  . loratadine (CLARITIN) 10 MG tablet Take 10 mg by mouth daily.   No current facility-administered medications on file prior to visit.    Allergies:  Allergies  Allergen Reactions  . Other     Pollen    Social History:  Social History   Socioeconomic History  . Marital status: Single    Spouse name: Not on file  . Number of children: Not on file  . Years of education: Not on file   . Highest education level: Not on file  Occupational History  . Not on file  Tobacco Use  . Smoking status: Never  . Smokeless tobacco: Never  Vaping Use  . Vaping status: Never Used  Substance and Sexual Activity  . Alcohol use: Not Currently  . Drug use: Never  . Sexual activity: Yes    Partners: Female  Other Topics Concern  . Not on file  Social History Narrative  . Not on file   Social Drivers of Health   Financial Resource Strain: Not on file  Food Insecurity: Not on file  Transportation Needs: Not on file  Physical Activity: Not on file  Stress: Not on file  Social Connections: Not on file  Intimate Partner Violence: Not on file   Social History   Tobacco Use  Smoking Status Never  Smokeless Tobacco Never   Social History   Substance and Sexual Activity  Alcohol Use Not Currently    Family History:  Family History  Problem Relation Age of Onset  . Clotting disorder Mother   . Clotting  disorder Maternal Uncle   . Clotting disorder Maternal Grandmother   . Cancer Maternal Grandmother        Skin  . COPD Maternal Grandfather   . Cancer Paternal Grandfather        Lung    Past medical history, surgical history, medications, allergies, family history and social history reviewed with patient today and changes made to appropriate areas of the chart.   Review of Systems  All other systems reviewed and are negative.  All other ROS negative except what is listed above and in the HPI.      Objective:    BP 117/76   Pulse (!) 54   Temp (!) 97.4 F (36.3 C) (Oral)   Ht 5\' 11"  (1.803 m)   Wt 177 lb 12.8 oz (80.6 kg)   SpO2 97%   BMI 24.80 kg/m   Wt Readings from Last 3 Encounters:  07/16/23 177 lb 12.8 oz (80.6 kg)  07/13/22 170 lb 6.4 oz (77.3 kg)  03/24/21 172 lb 3.2 oz (78.1 kg)    Physical Exam Vitals and nursing note reviewed.  Constitutional:      General: He is not in acute distress.    Appearance: Normal appearance. He is normal  weight. He is not ill-appearing, toxic-appearing or diaphoretic.  HENT:     Head: Normocephalic.     Right Ear: Tympanic membrane, ear canal and external ear normal.     Left Ear: Tympanic membrane, ear canal and external ear normal.     Nose: Nose normal. No congestion or rhinorrhea.     Mouth/Throat:     Mouth: Mucous membranes are moist.  Eyes:     General:        Right eye: No discharge.        Left eye: No discharge.     Extraocular Movements: Extraocular movements intact.     Conjunctiva/sclera: Conjunctivae normal.     Pupils: Pupils are equal, round, and reactive to light.  Cardiovascular:     Rate and Rhythm: Normal rate and regular rhythm.     Heart sounds: No murmur heard. Pulmonary:     Effort: Pulmonary effort is normal. No respiratory distress.     Breath sounds: Normal breath sounds. No wheezing, rhonchi or rales.  Abdominal:     General: Abdomen is flat. Bowel sounds are normal. There is no distension.     Palpations: Abdomen is soft.     Tenderness: There is no abdominal tenderness. There is no guarding.  Musculoskeletal:     Cervical back: Normal range of motion and neck supple.  Skin:    General: Skin is warm and dry.     Capillary Refill: Capillary refill takes less than 2 seconds.  Neurological:     General: No focal deficit present.     Mental Status: He is alert and oriented to person, place, and time.     Cranial Nerves: No cranial nerve deficit.     Motor: No weakness.     Deep Tendon Reflexes: Reflexes normal.  Psychiatric:        Mood and Affect: Mood normal.        Behavior: Behavior normal.        Thought Content: Thought content normal.        Judgment: Judgment normal.    Results for orders placed or performed in visit on 07/13/22  Urinalysis, Routine w reflex microscopic   Collection Time: 07/13/22 10:07 AM  Result Value Ref Range  Specific Gravity, UA 1.015 1.005 - 1.030   pH, UA 8.0 (H) 5.0 - 7.5   Color, UA Yellow Yellow    Appearance Ur Clear Clear   Leukocytes,UA Negative Negative   Protein,UA Negative Negative/Trace   Glucose, UA Negative Negative   Ketones, UA Negative Negative   RBC, UA Negative Negative   Bilirubin, UA Negative Negative   Urobilinogen, Ur 0.2 0.2 - 1.0 mg/dL   Nitrite, UA Negative Negative   Microscopic Examination Comment   TSH   Collection Time: 07/13/22 10:10 AM  Result Value Ref Range   TSH 1.660 0.450 - 4.500 uIU/mL  PSA   Collection Time: 07/13/22 10:10 AM  Result Value Ref Range   Prostate Specific Ag, Serum 1.3 0.0 - 4.0 ng/mL  Lipid panel   Collection Time: 07/13/22 10:10 AM  Result Value Ref Range   Cholesterol, Total 154 100 - 199 mg/dL   Triglycerides 78 0 - 149 mg/dL   HDL 64 >84 mg/dL   VLDL Cholesterol Cal 15 5 - 40 mg/dL   LDL Chol Calc (NIH) 75 0 - 99 mg/dL   Chol/HDL Ratio 2.4 0.0 - 5.0 ratio  CBC with Differential/Platelet   Collection Time: 07/13/22 10:10 AM  Result Value Ref Range   WBC 5.3 3.4 - 10.8 x10E3/uL   RBC 5.05 4.14 - 5.80 x10E6/uL   Hemoglobin 15.5 13.0 - 17.7 g/dL   Hematocrit 13.2 44.0 - 51.0 %   MCV 91 79 - 97 fL   MCH 30.7 26.6 - 33.0 pg   MCHC 33.9 31.5 - 35.7 g/dL   RDW 10.2 72.5 - 36.6 %   Platelets 214 150 - 450 x10E3/uL   Neutrophils 60 Not Estab. %   Lymphs 25 Not Estab. %   Monocytes 10 Not Estab. %   Eos 3 Not Estab. %   Basos 2 Not Estab. %   Neutrophils Absolute 3.2 1.4 - 7.0 x10E3/uL   Lymphocytes Absolute 1.3 0.7 - 3.1 x10E3/uL   Monocytes Absolute 0.5 0.1 - 0.9 x10E3/uL   EOS (ABSOLUTE) 0.2 0.0 - 0.4 x10E3/uL   Basophils Absolute 0.1 0.0 - 0.2 x10E3/uL   Immature Granulocytes 0 Not Estab. %   Immature Grans (Abs) 0.0 0.0 - 0.1 x10E3/uL  Comprehensive metabolic panel   Collection Time: 07/13/22 10:10 AM  Result Value Ref Range   Glucose 90 70 - 99 mg/dL   BUN 17 6 - 20 mg/dL   Creatinine, Ser 4.40 0.76 - 1.27 mg/dL   eGFR 347 >42 VZ/DGL/8.75   BUN/Creatinine Ratio 18 9 - 20   Sodium 141 134 - 144 mmol/L    Potassium 4.5 3.5 - 5.2 mmol/L   Chloride 103 96 - 106 mmol/L   CO2 23 20 - 29 mmol/L   Calcium 9.6 8.7 - 10.2 mg/dL   Total Protein 6.8 6.0 - 8.5 g/dL   Albumin 4.8 4.1 - 5.1 g/dL   Globulin, Total 2.0 1.5 - 4.5 g/dL   Albumin/Globulin Ratio 2.4 (H) 1.2 - 2.2   Bilirubin Total 0.6 0.0 - 1.2 mg/dL   Alkaline Phosphatase 49 44 - 121 IU/L   AST 38 0 - 40 IU/L   ALT 58 (H) 0 - 44 IU/L  HIV Antibody (routine testing w rflx)   Collection Time: 07/13/22 10:10 AM  Result Value Ref Range   HIV Screen 4th Generation wRfx Non Reactive Non Reactive  Hepatitis C Antibody   Collection Time: 07/13/22 10:10 AM  Result Value Ref Range   Hep  C Virus Ab Non Reactive Non Reactive      Assessment & Plan:   Problem List Items Addressed This Visit       Cardiovascular and Mediastinum   Bilateral pulmonary embolism (HCC)   Followed by Hematology.  On Xerelto.  Refilled for patient during visit today.        Relevant Medications   XARELTO  15 MG TABS tablet   Other Visit Diagnoses       Annual physical exam    -  Primary   Health maintenance reviewed during visit today.  Labs orderd.  Vaccines reviewed.   Relevant Orders   TSH   Lipid panel   CBC with Differential/Platelet   Comprehensive metabolic panel with GFR     Allergy, initial encounter       New referral placed for patient to see Allergy.   Relevant Orders   Ambulatory referral to Allergy     Screening for ischemic heart disease       Relevant Orders   Lipid panel     Encounter for behavioral health screening            Discussed aspirin prophylaxis for myocardial infarction prevention and decision was it was not indicated  LABORATORY TESTING:  Health maintenance labs ordered today as discussed above.    IMMUNIZATIONS:   - Tdap: Tetanus vaccination status reviewed: last tetanus booster within 10 years. - Influenza: Postponed to flu season - Pneumovax: Not applicable - Prevnar: Not applicable - COVID: Up to date -  HPV: Not applicable - Shingrix vaccine: Not applicable  SCREENING: - Colonoscopy: Not applicable  Discussed with patient purpose of the colonoscopy is to detect colon cancer at curable precancerous or early stages   - AAA Screening: Not applicable  -Hearing Test: Not applicable  -Spirometry: Not applicable   PATIENT COUNSELING:    Sexuality: Discussed sexually transmitted diseases, partner selection, use of condoms, avoidance of unintended pregnancy  and contraceptive alternatives.   Advised to avoid cigarette smoking.  I discussed with the patient that most people either abstain from alcohol or drink within safe limits (<=14/week and <=4 drinks/occasion for males, <=7/weeks and <= 3 drinks/occasion for females) and that the risk for alcohol disorders and other health effects rises proportionally with the number of drinks per week and how often a drinker exceeds daily limits.  Discussed cessation/primary prevention of drug use and availability of treatment for abuse.   Diet: Encouraged to adjust caloric intake to maintain  or achieve ideal body weight, to reduce intake of dietary saturated fat and total fat, to limit sodium intake by avoiding high sodium foods and not adding table salt, and to maintain adequate dietary potassium and calcium preferably from fresh fruits, vegetables, and low-fat dairy products.    stressed the importance of regular exercise  Injury prevention: Discussed safety belts, safety helmets, smoke detector, smoking near bedding or upholstery.   Dental health: Discussed importance of regular tooth brushing, flossing, and dental visits.   Follow up plan: NEXT PREVENTATIVE PHYSICAL DUE IN 1 YEAR. No follow-ups on file.

## 2023-07-17 ENCOUNTER — Encounter: Payer: Self-pay | Admitting: Nurse Practitioner

## 2023-07-17 LAB — LIPID PANEL
Chol/HDL Ratio: 2.6 ratio (ref 0.0–5.0)
Cholesterol, Total: 147 mg/dL (ref 100–199)
HDL: 56 mg/dL (ref >39)
LDL Chol Calc (NIH): 79 mg/dL (ref 0–99)
Triglycerides: 56 mg/dL (ref 0–149)
VLDL Cholesterol Cal: 12 mg/dL (ref 5–40)

## 2023-07-17 LAB — CBC WITH DIFFERENTIAL/PLATELET
Basophils Absolute: 0.1 10*3/uL (ref 0.0–0.2)
Basos: 2 %
EOS (ABSOLUTE): 0.2 10*3/uL (ref 0.0–0.4)
Eos: 4 %
Hematocrit: 42.9 % (ref 37.5–51.0)
Hemoglobin: 14.5 g/dL (ref 13.0–17.7)
Immature Grans (Abs): 0 10*3/uL (ref 0.0–0.1)
Immature Granulocytes: 0 %
Lymphocytes Absolute: 1.4 10*3/uL (ref 0.7–3.1)
Lymphs: 26 %
MCH: 30.3 pg (ref 26.6–33.0)
MCHC: 33.8 g/dL (ref 31.5–35.7)
MCV: 90 fL (ref 79–97)
Monocytes Absolute: 0.7 10*3/uL (ref 0.1–0.9)
Monocytes: 12 %
Neutrophils Absolute: 3 10*3/uL (ref 1.4–7.0)
Neutrophils: 56 %
Platelets: 217 10*3/uL (ref 150–450)
RBC: 4.78 x10E6/uL (ref 4.14–5.80)
RDW: 11.8 % (ref 11.6–15.4)
WBC: 5.4 10*3/uL (ref 3.4–10.8)

## 2023-07-17 LAB — COMPREHENSIVE METABOLIC PANEL WITH GFR
ALT: 18 IU/L (ref 0–44)
AST: 20 IU/L (ref 0–40)
Albumin: 4.4 g/dL (ref 4.1–5.1)
Alkaline Phosphatase: 51 IU/L (ref 44–121)
BUN/Creatinine Ratio: 15 (ref 9–20)
BUN: 16 mg/dL (ref 6–20)
Bilirubin Total: 0.3 mg/dL (ref 0.0–1.2)
CO2: 24 mmol/L (ref 20–29)
Calcium: 9.3 mg/dL (ref 8.7–10.2)
Chloride: 104 mmol/L (ref 96–106)
Creatinine, Ser: 1.09 mg/dL (ref 0.76–1.27)
Globulin, Total: 2.1 g/dL (ref 1.5–4.5)
Glucose: 89 mg/dL (ref 70–99)
Potassium: 4.3 mmol/L (ref 3.5–5.2)
Sodium: 140 mmol/L (ref 134–144)
Total Protein: 6.5 g/dL (ref 6.0–8.5)
eGFR: 91 mL/min/{1.73_m2} (ref >59)

## 2023-07-17 LAB — TSH: TSH: 1.24 u[IU]/mL (ref 0.450–4.500)

## 2024-01-17 ENCOUNTER — Ambulatory Visit: Payer: Self-pay | Admitting: Nurse Practitioner
